# Patient Record
Sex: Female | Born: 1960 | Race: Black or African American | Hispanic: No | Marital: Married | Smoking: Never smoker
Health system: Southern US, Community
[De-identification: ages and names within clinical notes are randomized; demographics above are authoritative.]

## PROBLEM LIST (undated history)

## (undated) DIAGNOSIS — F329 Major depressive disorder, single episode, unspecified: Secondary | ICD-10-CM

## (undated) DIAGNOSIS — Z1371 Encounter for nonprocreative screening for genetic disease carrier status: Secondary | ICD-10-CM

## (undated) DIAGNOSIS — R32 Unspecified urinary incontinence: Secondary | ICD-10-CM

## (undated) DIAGNOSIS — N390 Urinary tract infection, site not specified: Secondary | ICD-10-CM

## (undated) DIAGNOSIS — N809 Endometriosis, unspecified: Secondary | ICD-10-CM

## (undated) DIAGNOSIS — G459 Transient cerebral ischemic attack, unspecified: Secondary | ICD-10-CM

## (undated) DIAGNOSIS — D219 Benign neoplasm of connective and other soft tissue, unspecified: Secondary | ICD-10-CM

## (undated) DIAGNOSIS — E119 Type 2 diabetes mellitus without complications: Secondary | ICD-10-CM

## (undated) DIAGNOSIS — G4730 Sleep apnea, unspecified: Secondary | ICD-10-CM

## (undated) DIAGNOSIS — F419 Anxiety disorder, unspecified: Secondary | ICD-10-CM

## (undated) DIAGNOSIS — F32A Depression, unspecified: Secondary | ICD-10-CM

## (undated) DIAGNOSIS — I10 Essential (primary) hypertension: Secondary | ICD-10-CM

## (undated) HISTORY — DX: Endometriosis, unspecified: N80.9

## (undated) HISTORY — DX: Major depressive disorder, single episode, unspecified: F32.9

## (undated) HISTORY — DX: Unspecified urinary incontinence: R32

## (undated) HISTORY — DX: Urinary tract infection, site not specified: N39.0

## (undated) HISTORY — DX: Depression, unspecified: F32.A

## (undated) HISTORY — DX: Type 2 diabetes mellitus without complications: E11.9

## (undated) HISTORY — DX: Essential (primary) hypertension: I10

## (undated) HISTORY — DX: Benign neoplasm of connective and other soft tissue, unspecified: D21.9

## (undated) HISTORY — DX: Sleep apnea, unspecified: G47.30

## (undated) HISTORY — DX: Encounter for nonprocreative screening for genetic disease carrier status: Z13.71

## (undated) HISTORY — DX: Transient cerebral ischemic attack, unspecified: G45.9

## (undated) HISTORY — DX: Anxiety disorder, unspecified: F41.9

---

## 1999-08-02 ENCOUNTER — Encounter: Payer: Self-pay | Admitting: Family Medicine

## 1999-08-02 ENCOUNTER — Ambulatory Visit (HOSPITAL_COMMUNITY): Admission: RE | Admit: 1999-08-02 | Discharge: 1999-08-02 | Payer: Self-pay | Admitting: Family Medicine

## 2000-01-24 ENCOUNTER — Encounter: Payer: Self-pay | Admitting: Family Medicine

## 2000-01-24 ENCOUNTER — Encounter: Admission: RE | Admit: 2000-01-24 | Discharge: 2000-01-24 | Payer: Self-pay | Admitting: Family Medicine

## 2000-03-06 ENCOUNTER — Emergency Department (HOSPITAL_COMMUNITY): Admission: EM | Admit: 2000-03-06 | Discharge: 2000-03-06 | Payer: Self-pay | Admitting: Emergency Medicine

## 2000-03-06 ENCOUNTER — Encounter: Payer: Self-pay | Admitting: Emergency Medicine

## 2000-04-16 ENCOUNTER — Encounter: Payer: Self-pay | Admitting: Obstetrics and Gynecology

## 2000-04-16 ENCOUNTER — Ambulatory Visit (HOSPITAL_COMMUNITY): Admission: RE | Admit: 2000-04-16 | Discharge: 2000-04-16 | Payer: Self-pay | Admitting: Obstetrics and Gynecology

## 2000-05-07 ENCOUNTER — Emergency Department (HOSPITAL_COMMUNITY): Admission: EM | Admit: 2000-05-07 | Discharge: 2000-05-07 | Payer: Self-pay | Admitting: Emergency Medicine

## 2001-03-12 ENCOUNTER — Ambulatory Visit (HOSPITAL_COMMUNITY): Admission: RE | Admit: 2001-03-12 | Discharge: 2001-03-12 | Payer: Self-pay | Admitting: Obstetrics and Gynecology

## 2001-12-24 ENCOUNTER — Encounter: Admission: RE | Admit: 2001-12-24 | Discharge: 2001-12-24 | Payer: Self-pay | Admitting: Urology

## 2001-12-24 ENCOUNTER — Encounter: Payer: Self-pay | Admitting: Urology

## 2002-05-26 ENCOUNTER — Other Ambulatory Visit: Admission: RE | Admit: 2002-05-26 | Discharge: 2002-05-26 | Payer: Self-pay | Admitting: Gynecology

## 2003-03-29 ENCOUNTER — Inpatient Hospital Stay (HOSPITAL_COMMUNITY): Admission: RE | Admit: 2003-03-29 | Discharge: 2003-04-01 | Payer: Self-pay | Admitting: Obstetrics and Gynecology

## 2003-03-29 ENCOUNTER — Encounter (INDEPENDENT_AMBULATORY_CARE_PROVIDER_SITE_OTHER): Payer: Self-pay | Admitting: *Deleted

## 2003-04-23 HISTORY — PX: ABDOMINAL HYSTERECTOMY: SHX81

## 2004-04-04 ENCOUNTER — Other Ambulatory Visit: Admission: RE | Admit: 2004-04-04 | Discharge: 2004-04-04 | Payer: Self-pay | Admitting: Obstetrics and Gynecology

## 2005-04-22 DIAGNOSIS — I10 Essential (primary) hypertension: Secondary | ICD-10-CM

## 2005-04-22 HISTORY — DX: Essential (primary) hypertension: I10

## 2005-04-22 HISTORY — PX: OOPHORECTOMY: SHX86

## 2007-01-07 ENCOUNTER — Encounter (HOSPITAL_BASED_OUTPATIENT_CLINIC_OR_DEPARTMENT_OTHER): Payer: Self-pay | Admitting: General Surgery

## 2007-01-07 ENCOUNTER — Ambulatory Visit (HOSPITAL_BASED_OUTPATIENT_CLINIC_OR_DEPARTMENT_OTHER): Admission: RE | Admit: 2007-01-07 | Discharge: 2007-01-07 | Payer: Self-pay | Admitting: General Surgery

## 2008-04-22 HISTORY — PX: OTHER SURGICAL HISTORY: SHX169

## 2008-11-16 ENCOUNTER — Encounter: Admission: RE | Admit: 2008-11-16 | Discharge: 2008-11-16 | Payer: Self-pay | Admitting: Obstetrics and Gynecology

## 2009-03-22 ENCOUNTER — Encounter (INDEPENDENT_AMBULATORY_CARE_PROVIDER_SITE_OTHER): Payer: Self-pay | Admitting: Obstetrics and Gynecology

## 2009-03-22 ENCOUNTER — Ambulatory Visit (HOSPITAL_COMMUNITY): Admission: RE | Admit: 2009-03-22 | Discharge: 2009-03-23 | Payer: Self-pay | Admitting: Obstetrics and Gynecology

## 2010-07-24 LAB — CBC
HCT: 37.4 % (ref 36.0–46.0)
MCHC: 32.1 g/dL (ref 30.0–36.0)
MCV: 88.5 fL (ref 78.0–100.0)
RBC: 4.23 MIL/uL (ref 3.87–5.11)
WBC: 12.2 10*3/uL — ABNORMAL HIGH (ref 4.0–10.5)

## 2010-07-25 LAB — URINALYSIS, ROUTINE W REFLEX MICROSCOPIC
Bilirubin Urine: NEGATIVE
Glucose, UA: NEGATIVE mg/dL
Hgb urine dipstick: NEGATIVE
Ketones, ur: NEGATIVE mg/dL
Protein, ur: NEGATIVE mg/dL
pH: 6.5 (ref 5.0–8.0)

## 2010-07-25 LAB — CBC
HCT: 42.9 % (ref 36.0–46.0)
MCHC: 32.1 g/dL (ref 30.0–36.0)
MCV: 88.4 fL (ref 78.0–100.0)
Platelets: 235 10*3/uL (ref 150–400)
RDW: 12.8 % (ref 11.5–15.5)
WBC: 5.1 10*3/uL (ref 4.0–10.5)

## 2010-07-25 LAB — BASIC METABOLIC PANEL
BUN: 9 mg/dL (ref 6–23)
CO2: 29 mEq/L (ref 19–32)
Chloride: 103 mEq/L (ref 96–112)
Glucose, Bld: 99 mg/dL (ref 70–99)
Potassium: 3.6 mEq/L (ref 3.5–5.1)

## 2010-08-26 ENCOUNTER — Emergency Department (HOSPITAL_COMMUNITY): Payer: BC Managed Care – PPO

## 2010-08-26 ENCOUNTER — Emergency Department (HOSPITAL_COMMUNITY)
Admission: EM | Admit: 2010-08-26 | Discharge: 2010-08-26 | Disposition: A | Discharge status: Home / Self Care | Payer: BC Managed Care – PPO | Attending: Emergency Medicine | Admitting: Emergency Medicine

## 2010-08-26 DIAGNOSIS — R42 Dizziness and giddiness: Secondary | ICD-10-CM | POA: Insufficient documentation

## 2010-08-26 DIAGNOSIS — R1110 Vomiting, unspecified: Secondary | ICD-10-CM | POA: Insufficient documentation

## 2010-08-26 DIAGNOSIS — I10 Essential (primary) hypertension: Secondary | ICD-10-CM | POA: Insufficient documentation

## 2010-08-26 LAB — COMPREHENSIVE METABOLIC PANEL
Albumin: 4 g/dL (ref 3.5–5.2)
BUN: 11 mg/dL (ref 6–23)
CO2: 30 mEq/L (ref 19–32)
Chloride: 98 mEq/L (ref 96–112)
Creatinine, Ser: 0.5 mg/dL (ref 0.4–1.2)
GFR calc non Af Amer: >60 mL/min (ref >60)
Total Bilirubin: 0.6 mg/dL (ref 0.3–1.2)

## 2010-08-26 LAB — CBC
MCH: 28.4 pg (ref 26.0–34.0)
MCV: 85.9 fL (ref 78.0–100.0)
Platelets: 219 10*3/uL (ref 150–400)
RBC: 5.18 MIL/uL — ABNORMAL HIGH (ref 3.87–5.11)

## 2010-08-26 LAB — DIFFERENTIAL
Eosinophils Absolute: 0 10*3/uL (ref 0.0–0.7)
Lymphs Abs: 1.9 10*3/uL (ref 0.7–4.0)
Monocytes Absolute: 0.7 10*3/uL (ref 0.1–1.0)
Monocytes Relative: 9 % (ref 3–12)
Neutrophils Relative %: 67 % (ref 43–77)

## 2010-08-27 ENCOUNTER — Other Ambulatory Visit (HOSPITAL_COMMUNITY): Payer: Self-pay | Admitting: Emergency Medicine

## 2010-08-27 DIAGNOSIS — R42 Dizziness and giddiness: Secondary | ICD-10-CM

## 2010-08-31 ENCOUNTER — Ambulatory Visit (HOSPITAL_COMMUNITY)
Admission: RE | Admit: 2010-08-31 | Discharge: 2010-08-31 | Disposition: A | Discharge status: Home / Self Care | Payer: BC Managed Care – PPO | Source: Ambulatory Visit | Attending: Emergency Medicine | Admitting: Emergency Medicine

## 2010-08-31 DIAGNOSIS — R42 Dizziness and giddiness: Secondary | ICD-10-CM

## 2010-08-31 DIAGNOSIS — D1800 Hemangioma unspecified site: Secondary | ICD-10-CM | POA: Insufficient documentation

## 2010-08-31 DIAGNOSIS — R269 Unspecified abnormalities of gait and mobility: Secondary | ICD-10-CM | POA: Insufficient documentation

## 2010-08-31 MED ORDER — GADOBENATE DIMEGLUMINE 529 MG/ML IV SOLN
20.0000 mL | Freq: Once | INTRAVENOUS | Status: AC
  Administered 2010-08-31: 20 mL via INTRAVENOUS

## 2010-09-04 NOTE — Op Note (Signed)
NAMERAYONA, SARDINHA               ACCOUNT NO.:  [id number]   MEDICAL RECORD NO.:  [id number]          PATIENT TYPE:  AMB   LOCATION:  DSC                          FACILITY:  [facility]   PHYSICIAN:  Leonie Man, M.D.   DATE OF BIRTH:  04/26/1960   DATE OF PROCEDURE:  01/07/2007  DATE OF DISCHARGE:                               OPERATIVE REPORT   PREOPERATIVE DIAGNOSIS:  Hemorrhoids.   POSTOPERATIVE DIAGNOSIS:  Hemorrhoids.   PROCEDURE:  Hemorrhoidectomy with removal of anal tags.   SURGEON:  Leonie Man, M.D.   ASSISTANT:  Operating room technician.   ANESTHESIA:  General.   Ms. Arciga is a 50 year old patient presenting with external hemorrhoids  with some internal prolapse associated with some bleeding.  She comes to  the operating room now for hemorrhoidectomy after the risk and potential  benefits of surgery had been fully discussed, all questions answered and  consent obtained.   PROCEDURE:  The patient positioned in the lithotomy position.  Following  the induction of satisfactory general anesthesia, the perianal tissues  are prepped with Betadine and draped to be included in the sterile  operative field.  A large hemorrhoidal complex is located at the  anterior midline and 8 o'clock axes.  These were both infiltrated with  0.5% Marcaine with epinephrine.  I used the electrocautery to dissect  across the mid cutaneous junction, dissecting the hemorrhoid free from  off of the underlying sphincter and removing it in its entirety.  The  second hemorrhoid was dealt with in the same fashion after injection  with 0.5% Marcaine.  Both of these hemorrhoidal incisions were closed  with a running suture of 2-0 chromic catgut.  All the areas of  dissection checked for hemostasis noted to be dry.  Sponge and  instrument counts were verified.  I used lidocaine soaked Gelfoam pads  to place over the hemorrhoidal incisions and a sterile dressing was  applied.  The anesthetic  reversed, patient removed from the operating  room to the recovery room in stable condition.  She tolerated the  procedure well.      Leonie Man, M.D.  Electronically Signed     PB/MEDQ  D:  01/07/2007  T:  01/07/2007  Job:  [id number]   cc:   Leonie Man, M.D.

## 2010-09-07 NOTE — Op Note (Signed)
[name]  Patient:    Deanna Caldwell, Deanna Caldwell Visit Number: [id number] MRN: [id number]          Service Type: DSU Location: [facility] Attending Physician:  Deanna Caldwell Dictated by:   Deanna Caldwell, M.D. Proc. Date: 03/12/01 Admit Date:  03/12/2001                             Operative Report  PREOPERATIVE DIAGNOSES:       Pelvic pain, increasing dysmenorrhea.  POSTOPERATIVE DIAGNOSES:      Pelvic adhesions, minimal pelvic endometriosis.  PROCEDURE:                    Diagnostic laparoscopy with lysis of adhesions and laser ablation of uterosacral ligaments.  SURGEON:                      Deanna Caldwell, M.D.  ANESTHESIA:                   General.  COMPLICATIONS:                None.  JUSTIFICATION:                The patient is a 50 year old black female status post prior tubal ligation with a history of persistent pelvic pain and increasing dysmenorrhea that did not respond to conservative treatment using nonsteroidal anti-inflammatory agents and antibiotics.  She presents now to undergo laparoscopy to see if an etiology for the pain can be established and corrected.  Risks and benefits of the procedure have been discussed with patient.  PROCEDURE:                    Patient was taken to the operating room, placed in the supine position.  General anesthesia was administered without difficulty.  She was then placed in the dorsal lithotomy position, prepped and draped in the usual sterile fashion.  Bladder was catheterized.  A Kahn cannula was then placed in the cervix and held.  Once this was done attention was directed to the abdomen where a small infraumbilical incision was made. The Veress needle was inserted and the abdomen was insufflated with 3 L of CO2.  Following this the trocar with the laparoscope was placed followed by the laparoscope itself.  To allow better visualization a 5 mm suprapubic port was established under direct visualization.   Systematic inspection showed the anterior bladder, peritoneum to be unremarkable.  The uterus was somewhat irregular in shape, globular, approximately 8 weeks in size.  Appeared to have small fibroids within the corpus of the uterus.  Patients tubes were then inspected.  Both proximal segments of the tubes were missing and only distal segments remained consistent with history of prior tubal sterilization.  There was very little, if any, proximal tubal segment left.  Distal tubal segments appeared to be normal; however, there were adhesions present between the distal segments and the lateral pelvic side wall and ovary.  In the cul-de-sac there were some filmy adhesions noticed and careful inspection of the peritoneum and cul-de-sac revealed very small pale white plaque like lesions which appeared to be consistent with nonpigmented endometriosis.  The intestinal surfaces appeared to be unremarkable.  The liver was inspected and was noted to be within normal limits.  The gallbladder appeared to be within normal limits.  No other abnormalities were noted.  By placing the  adhesions on stretch it was possible to use laparoscopic scissors to cut the adhesions down without difficulty and restore the anatomy to a normal configuration and to free the ovaries off the lateral pelvic side wall.  Once this was done it was elected to try to decrease the patients pain by using the YAG laser to laser ablate the uterosacral nerve.  The YAG laser was used with 15 watts of power with a GRP6 laser tip and the uterosacral ligaments were partially transected with care to avoid injury to any adjacent vascular structure or the ureter.  This was done without difficulty and with good hemostasis at the completion of the procedure.  At this point with no other abnormalities being noted it was elected to complete the procedure.  The CO2 was allowed to escape.  All instruments were removed.  The small incisions were  closed using subcuticular 4-0 Vicryl.  The port sites were injected with 0.25% Marcaine.  The patient tolerated the procedure well and was taken to the recovery room in satisfactory condition.  PLAN:                         The patient is to be seen back in the office in four weeks for follow-up examination.  She is to call for any problems such as fever, pain, or heavy bleeding.  DISCHARGE MEDICATIONS:        1. Tylox one q.3h. p.r.n. for pain.                               2. Doxycycline 100 mg b.i.d. x 3 days. Dictated by:   Deanna Caldwell, M.D. Attending Physician:  Deanna Caldwell DD:  03/12/01 TD:  03/12/01 Job: [id number]

## 2010-09-07 NOTE — Discharge Summary (Signed)
NAME:  Deanna Caldwell, Deanna Caldwell                         ACCOUNT NO.:  [id number]   MEDICAL RECORD NO.:  [id number]                   PATIENT TYPE:  INP   LOCATION:  9325                                 [facility]ACILITY:  [facility]   PHYSICIAN:  Miguel Aschoff, M.D.                    DATE OF BIRTH:  January 11, 1961   DATE OF ADMISSION:  03/29/2003  DATE OF DISCHARGE:  04/01/2003                                 DISCHARGE SUMMARY   ADMISSION DIAGNOSES:  1. Uterine fibroids.  2. Chronic pelvic pain.  3. Pelvic adhesions.  4. Menorrhagia.   BRIEF HISTORY:  The patient is a 50 year old black female with a long  history of heavy vaginal bleeding and pelvic pain.  The patient has been  treated in the past for pelvic pain with laparoscopy at which time she was  noted to have endometriosis.  In addition, she has used a variety of agents  to try to control the pelvic pain without success.  Because of her  symptomatology and the desire for definitive therapy, she presents now to  undergo a total abdominal hysterectomy.   HOSPITAL COURSE:  Preoperative studies were obtained. These revealed an  admission hemoglobin of 10.4, hematocrit 33.5, white count 7,700.  Chemistry  profile was within normal limits.  Urinalysis was negative.  On March 29, 2003 under general anesthesia a total abdominal hysterectomy was carried out  without difficulty.  This revealed the presence of a 188 gram uterus.  The  patient was noted to have benign leiomyoma, focal adenomyosis and multiple  serosal and fibrovascular adhesions.  The patient's hospital course was  essentially uncomplicated.  She tolerated increasing ambulation and diet  well.  Her hemoglobin did drop to 8.4 and remained stable.  By April 01, 2003 she was in satisfactory condition and able to be sent home.   DISCHARGE MEDICATIONS:  1. Chromagen iron therapy one daily for 30 days.  2. Tylox one every three hours as needed for pain.   SPECIAL INSTRUCTIONS:  She was  instructed in no heavy lifting, to place  nothing in the vagina or to call if there were any problems such as fever,  pain or heavy bleeding.   FOLLOW UP:  The patient is to be seen back in four weeks for a follow-up  examination.   DISPOSITION:  She was sent home in satisfactory condition.                                               Miguel Aschoff, M.D.    AR/MEDQ  D:  05/11/2003  T:  05/12/2003  Job:  [id number]

## 2010-09-07 NOTE — Op Note (Signed)
NAME:  Deanna Caldwell, Deanna Caldwell                         ACCOUNT NO.:  [id number]   MEDICAL RECORD NO.:  [id number]                   PATIENT TYPE:  INP   LOCATION:  NA                                   [facility]  [facility]   PHYSICIAN:  Miguel Aschoff, M.D.                    DATE OF BIRTH:  12-Oct-1960   DATE OF PROCEDURE:  03/29/2003  DATE OF DISCHARGE:                                 OPERATIVE REPORT   PREOPERATIVE DIAGNOSES:  1. Menorrhagia.  2. Chronic pelvic pain.  3. Uterine fibroids.   POSTOPERATIVE DIAGNOSES:  1. Menorrhagia.  2. Chronic pelvic pain.  3. Uterine fibroids.  4. Pelvic adhesions.   PROCEDURE:  Total abdominal hysterectomy.   SURGEON:  Miguel Aschoff, M.D.   ASSISTANT:  Carrington Clamp, M.D.   ANESTHESIA:  General.   COMPLICATIONS:  None.   JUSTIFICATION:  The patient is a 50 year old black female, gravida 5, para 3-  0-2-3, with a history of progressive increasing menorrhagia and pelvic pain.  Previous laparoscopy revealed the presence of uterine fibroids and  endometriosis.  Due to progression of her symptoms with failure to respond  to medical therapy, she presents now to undergo definitive therapy via a  total abdominal hysterectomy.  The risks and benefits of the procedure were  discussed with the patient.   DESCRIPTION OF PROCEDURE:  The patient was taken to the operating room and  placed in a supine position, and general anesthesia was administered without  difficulty.  She was then prepped and draped in the usual sterile fashion, a  Foley catheter was inserted.  A Pfannenstiel incision was made, extended  down through the subcutaneous tissue with bleeding points being clamped and  coagulated as they were encountered.  The fascia was then identified and  incised transversely and then separated from the underlying rectus muscles.  Rectus muscles were divided in the midline.  The peritoneum was then  identified and entered, carefully avoiding underlying structures.   At this  point the viscera were packed out of the pelvis, a self-retaining retractor  was placed through the wound.  Inspection revealed the uterus to be  globular, approximately eight to 10 weeks' size, with the presence of  uterine fibroids.  In addition, it was noted that the ovaries were adherent  to the posterior surface of the uterus by filmy adhesions.  The cul-de-sac  was unremarkable.  The intestinal surfaces appear to be unremarkable.  The  patient showed evidence of prior tubal sterilization.  At this point the  round ligaments were identified, clamped, cut, and suture ligated using  suture ligatures of 0 Vicryl.  Then a bladder flap was created anteriorly.  Preparations were then made below the utero-ovarian ligaments.  Heaney  clamps were then used to clamp the utero-ovarian ligament.  These were then  cut and suture ligated first with suture ligatures of 0 Vicryl and then free  ties of 0 Vicryl.  The broad ligament was then skeletonized, uterine vessels  identified, clamped with curved Heaney clamps.  These pedicles were cut and  suture ligated using suture ligatures of 0 Vicryl.  Then in serial fashion  on the right and left sides using straight Heaney clamps, progressive bites  were taken of the paracervical fascia.  All pedicles were cut and suture  ligated using suture ligatures of 0 Vicryl.  The uterosacral ligaments were  then found, clamped, cut, and suture ligated using suture ligatures of 0  Vicryl, and the cardinal ligaments were clamped, cut, and suture ligated in  a similar fashion.  At this point it was possible to clamp across the  reflection of the vagina onto the cervix.  Then the specimen was incised,  consisting of the uterus and the cervix.  The angles of the vaginal cuff  were then ligated using figure-of-eight sutures of 0 Vicryl.  The  uterosacral ligaments were incorporated for support.  The cuff was then  closed using running interlocking 0 Vicryl  suture.  The pelvic floor was  then reperitonealized using running continuous 2-0 Vicryl suture, pedicles  were placed in retroperitoneal positions, and there was excellent  hemostasis.  The pelvis then irrigated with warm saline.  Lap counts were  taken and found to be correct, and at this point the abdomen was closed.  The parietal peritoneum was closed using running continuous 0 Vicryl.  The  rectus muscles were reapproximated using running continuous 0 Vicryl suture.  The fascia was closed using two sutures of 0 Vicryl, each starting in the  lateral fascial angles and meeting in the midline.  The subcutaneous tissue  was closed using interrupted 0 Vicryl sutures and the skin incision was  closed using staples.  The estimated blood loss was approximately 100 mL.  The patient tolerated the procedure well and went to the recovery room in  satisfactory condition.                                               Miguel Aschoff, M.D.    AR/MEDQ  D:  03/29/2003  T:  03/29/2003  Job:  [id number]

## 2011-01-31 LAB — POCT HEMOGLOBIN-HEMACUE
Hemoglobin: 15.6 — ABNORMAL HIGH
Operator id: 208731

## 2011-06-25 DIAGNOSIS — H0589 Other disorders of orbit: Secondary | ICD-10-CM | POA: Insufficient documentation

## 2011-07-18 ENCOUNTER — Other Ambulatory Visit: Payer: Self-pay | Admitting: Obstetrics and Gynecology

## 2011-07-27 ENCOUNTER — Ambulatory Visit
Admission: RE | Admit: 2011-07-27 | Discharge: 2011-07-27 | Disposition: A | Discharge status: Home / Self Care | Payer: BC Managed Care – PPO | Source: Ambulatory Visit | Attending: Obstetrics and Gynecology | Admitting: Obstetrics and Gynecology

## 2011-07-27 MED ORDER — GADOBENATE DIMEGLUMINE 529 MG/ML IV SOLN
20.0000 mL | Freq: Once | INTRAVENOUS | Status: AC | PRN
  Administered 2011-07-27: 20 mL via INTRAVENOUS

## 2011-10-04 DIAGNOSIS — G459 Transient cerebral ischemic attack, unspecified: Secondary | ICD-10-CM

## 2011-10-04 HISTORY — DX: Transient cerebral ischemic attack, unspecified: G45.9

## 2011-10-16 ENCOUNTER — Encounter (HOSPITAL_COMMUNITY): Payer: Self-pay

## 2011-10-16 NOTE — Telephone Encounter (Signed)
This encounter was created in error - please disregard.

## 2011-11-08 ENCOUNTER — Encounter (HOSPITAL_COMMUNITY): Payer: Self-pay | Admitting: Psychiatry

## 2011-11-08 ENCOUNTER — Ambulatory Visit (INDEPENDENT_AMBULATORY_CARE_PROVIDER_SITE_OTHER): Payer: BC Managed Care – PPO | Admitting: Psychiatry

## 2011-11-08 VITALS — BP 126/75 | HR 69 | Ht 66.0 in | Wt 208.0 lb

## 2011-11-08 DIAGNOSIS — F411 Generalized anxiety disorder: Secondary | ICD-10-CM

## 2011-11-08 DIAGNOSIS — F329 Major depressive disorder, single episode, unspecified: Secondary | ICD-10-CM

## 2011-11-08 MED ORDER — VENLAFAXINE HCL ER 75 MG PO CP24
ORAL_CAPSULE | ORAL | Status: DC
Start: 2011-11-08 — End: 2011-12-18

## 2011-11-08 MED ORDER — VENLAFAXINE HCL ER 75 MG PO CP24: ORAL_CAPSULE | ORAL | Status: DC
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
## 2011-11-12 DIAGNOSIS — F329 Major depressive disorder, single episode, unspecified: Secondary | ICD-10-CM | POA: Insufficient documentation

## 2011-11-28 ENCOUNTER — Other Ambulatory Visit (HOSPITAL_COMMUNITY): Payer: Self-pay

## 2011-11-28 NOTE — Telephone Encounter (Signed)
Called patient. Left message that I would call tommorow.

## 2011-11-29 ENCOUNTER — Telehealth (HOSPITAL_COMMUNITY): Payer: Self-pay | Admitting: Psychiatry

## 2011-11-29 DIAGNOSIS — F329 Major depressive disorder, single episode, unspecified: Secondary | ICD-10-CM

## 2011-11-29 MED ORDER — DULOXETINE HCL 30 MG PO CPEP: ORAL_CAPSULE | ORAL | Status: DC

## 2011-11-29 NOTE — Telephone Encounter (Signed)
Patient reports that she prefers cymbalta 60 mg.   PLAN: 1. Will restart cymbalta 30 mg for 7 days, then increase to 60 mg daily.  Will taper effexor 75 mg daily for 7 days, then 75 mg every other day for 7 days then stop.

## 2011-12-05 ENCOUNTER — Ambulatory Visit (HOSPITAL_COMMUNITY): Payer: BC Managed Care – PPO | Admitting: Psychiatry

## 2011-12-18 ENCOUNTER — Encounter (HOSPITAL_COMMUNITY): Payer: Self-pay | Admitting: Psychiatry

## 2011-12-18 ENCOUNTER — Ambulatory Visit (INDEPENDENT_AMBULATORY_CARE_PROVIDER_SITE_OTHER): Payer: BC Managed Care – PPO | Admitting: Psychiatry

## 2011-12-18 VITALS — BP 137/83 | HR 76 | Ht 66.0 in | Wt 207.5 lb

## 2011-12-18 DIAGNOSIS — F329 Major depressive disorder, single episode, unspecified: Secondary | ICD-10-CM

## 2011-12-18 DIAGNOSIS — F411 Generalized anxiety disorder: Secondary | ICD-10-CM

## 2011-12-18 MED ORDER — VENLAFAXINE HCL ER 37.5 MG PO CP24: ORAL_CAPSULE | ORAL | Status: DC
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
## 2011-12-20 ENCOUNTER — Telehealth (HOSPITAL_COMMUNITY): Payer: Self-pay

## 2011-12-20 NOTE — Telephone Encounter (Signed)
PT WOULD LIKE YOU TO WRITE HER OUT OF WORK FOR 2 WKS.

## 2011-12-20 NOTE — Telephone Encounter (Signed)
Patient reports that she wants to take leave and has asked that this provider would write a note for leave. I have instructed to have the patient consider FMLA.

## 2011-12-23 ENCOUNTER — Encounter (HOSPITAL_COMMUNITY): Payer: Self-pay | Admitting: Psychiatry

## 2012-01-20 ENCOUNTER — Ambulatory Visit (HOSPITAL_COMMUNITY): Payer: Self-pay | Admitting: Psychiatry

## 2012-01-21 ENCOUNTER — Encounter (HOSPITAL_COMMUNITY): Payer: Self-pay | Admitting: Psychiatry

## 2012-01-31 ENCOUNTER — Telehealth (HOSPITAL_COMMUNITY): Payer: Self-pay

## 2012-01-31 DIAGNOSIS — F329 Major depressive disorder, single episode, unspecified: Secondary | ICD-10-CM

## 2012-01-31 MED ORDER — DULOXETINE HCL 60 MG PO CPEP: 60.0000 mg | ORAL_CAPSULE | Freq: Two times a day (BID) | ORAL | Status: DC

## 2012-01-31 NOTE — Telephone Encounter (Signed)
Will change dosage of cymbalta 60 mg bid.

## 2012-02-05 ENCOUNTER — Encounter (HOSPITAL_COMMUNITY): Payer: Self-pay | Admitting: Psychiatry

## 2012-02-05 ENCOUNTER — Ambulatory Visit (INDEPENDENT_AMBULATORY_CARE_PROVIDER_SITE_OTHER): Payer: BC Managed Care – PPO | Admitting: Psychiatry

## 2012-02-05 VITALS — BP 126/79 | HR 84 | Ht 66.0 in | Wt 206.0 lb

## 2012-02-05 DIAGNOSIS — F411 Generalized anxiety disorder: Secondary | ICD-10-CM

## 2012-02-05 DIAGNOSIS — F329 Major depressive disorder, single episode, unspecified: Secondary | ICD-10-CM
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
## 2012-03-02 ENCOUNTER — Encounter (HOSPITAL_COMMUNITY): Payer: Self-pay | Admitting: Psychiatry

## 2012-03-02 ENCOUNTER — Ambulatory Visit (INDEPENDENT_AMBULATORY_CARE_PROVIDER_SITE_OTHER): Payer: BC Managed Care – PPO | Admitting: Psychiatry

## 2012-03-02 VITALS — BP 126/87 | HR 95 | Ht 66.0 in | Wt 210.0 lb

## 2012-03-02 DIAGNOSIS — F419 Anxiety disorder, unspecified: Secondary | ICD-10-CM

## 2012-03-02 DIAGNOSIS — F411 Generalized anxiety disorder: Secondary | ICD-10-CM

## 2012-03-02 DIAGNOSIS — F329 Major depressive disorder, single episode, unspecified: Secondary | ICD-10-CM

## 2012-03-02 MED ORDER — DULOXETINE HCL 60 MG PO CPEP: 60.0000 mg | ORAL_CAPSULE | Freq: Every day | ORAL | Status: DC
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
## 2012-03-06 ENCOUNTER — Ambulatory Visit (HOSPITAL_COMMUNITY): Payer: Self-pay | Admitting: Psychiatry

## 2012-03-11 ENCOUNTER — Encounter (HOSPITAL_COMMUNITY): Payer: Self-pay | Admitting: Psychiatry

## 2012-03-11 ENCOUNTER — Ambulatory Visit (INDEPENDENT_AMBULATORY_CARE_PROVIDER_SITE_OTHER): Payer: BC Managed Care – PPO | Admitting: Psychiatry

## 2012-03-11 VITALS — BP 120/85 | HR 85 | Ht 66.0 in | Wt 206.0 lb

## 2012-03-11 DIAGNOSIS — F411 Generalized anxiety disorder: Secondary | ICD-10-CM

## 2012-03-11 DIAGNOSIS — F329 Major depressive disorder, single episode, unspecified: Secondary | ICD-10-CM

## 2012-03-11 MED ORDER — DULOXETINE HCL 30 MG PO CPEP: ORAL_CAPSULE | ORAL | Status: DC
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
## 2012-03-12 ENCOUNTER — Telehealth (HOSPITAL_COMMUNITY): Payer: Self-pay

## 2012-03-12 NOTE — Telephone Encounter (Signed)
Called patient. Will fill patient's form when patient's therapist gets clinic note.

## 2012-03-17 ENCOUNTER — Telehealth (HOSPITAL_COMMUNITY): Payer: Self-pay

## 2012-03-26 ENCOUNTER — Ambulatory Visit (INDEPENDENT_AMBULATORY_CARE_PROVIDER_SITE_OTHER): Payer: BC Managed Care – PPO | Admitting: Psychiatry

## 2012-03-26 ENCOUNTER — Encounter (HOSPITAL_COMMUNITY): Payer: Self-pay | Admitting: Psychiatry

## 2012-03-26 VITALS — BP 132/81 | HR 73 | Ht 66.0 in | Wt 202.0 lb

## 2012-03-26 DIAGNOSIS — F411 Generalized anxiety disorder: Secondary | ICD-10-CM

## 2012-03-26 DIAGNOSIS — F329 Major depressive disorder, single episode, unspecified: Secondary | ICD-10-CM

## 2012-03-26 MED ORDER — DULOXETINE HCL 30 MG PO CPEP: ORAL_CAPSULE | ORAL | Status: DC
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
## 2012-03-30 ENCOUNTER — Ambulatory Visit (HOSPITAL_COMMUNITY): Payer: Self-pay | Admitting: Psychiatry

## 2012-04-01 ENCOUNTER — Telehealth (HOSPITAL_COMMUNITY): Payer: Self-pay

## 2012-04-01 NOTE — Telephone Encounter (Signed)
Pt has returned to work but needs her paperwork updated to say working 1/2 days [phone]

## 2012-04-03 NOTE — Telephone Encounter (Signed)
Called patient back, left message stating I would call her back regarding her paperwork.

## 2012-04-10 ENCOUNTER — Telehealth (HOSPITAL_COMMUNITY): Payer: Self-pay

## 2012-04-10 NOTE — Telephone Encounter (Signed)
Called patient at home. She is doing.well. Taking care of herself.  She is taking her medications and denies any side effects.

## 2012-04-22 DIAGNOSIS — G4730 Sleep apnea, unspecified: Secondary | ICD-10-CM

## 2012-04-22 HISTORY — DX: Sleep apnea, unspecified: G47.30

## 2012-04-27 ENCOUNTER — Ambulatory Visit (HOSPITAL_COMMUNITY): Payer: Self-pay | Admitting: Psychiatry

## 2012-07-17 ENCOUNTER — Ambulatory Visit (INDEPENDENT_AMBULATORY_CARE_PROVIDER_SITE_OTHER): Payer: BC Managed Care – PPO | Admitting: Psychiatry

## 2012-07-17 ENCOUNTER — Encounter (HOSPITAL_COMMUNITY): Payer: Self-pay | Admitting: Psychiatry

## 2012-07-17 VITALS — BP 111/79 | HR 86 | Ht 66.0 in | Wt 211.0 lb

## 2012-07-17 DIAGNOSIS — F331 Major depressive disorder, recurrent, moderate: Secondary | ICD-10-CM

## 2012-07-17 DIAGNOSIS — F411 Generalized anxiety disorder: Secondary | ICD-10-CM

## 2012-07-17 DIAGNOSIS — F329 Major depressive disorder, single episode, unspecified: Secondary | ICD-10-CM

## 2012-07-17 MED ORDER — DULOXETINE HCL 60 MG PO CPEP: 60.0000 mg | ORAL_CAPSULE | Freq: Every day | ORAL | Status: DC
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
## 2012-08-05 ENCOUNTER — Ambulatory Visit (INDEPENDENT_AMBULATORY_CARE_PROVIDER_SITE_OTHER): Payer: BC Managed Care – PPO | Admitting: Psychiatry

## 2012-08-05 ENCOUNTER — Encounter (HOSPITAL_COMMUNITY): Payer: Self-pay | Admitting: Psychiatry

## 2012-08-05 VITALS — BP 129/86 | HR 82 | Ht 66.0 in | Wt 209.0 lb

## 2012-08-05 DIAGNOSIS — F329 Major depressive disorder, single episode, unspecified: Secondary | ICD-10-CM

## 2012-08-05 DIAGNOSIS — F411 Generalized anxiety disorder: Secondary | ICD-10-CM
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
## 2012-08-10 ENCOUNTER — Telehealth (HOSPITAL_COMMUNITY): Payer: Self-pay | Admitting: *Deleted

## 2012-08-10 NOTE — Telephone Encounter (Signed)
Pt would like you to call her regarding her return to work date being changed to 08/17/12 provider came in and call was transferred

## 2012-08-10 NOTE — Telephone Encounter (Signed)
Acknowleged. Called patient she will have company send in forms.

## 2012-08-12 ENCOUNTER — Telehealth (HOSPITAL_COMMUNITY): Payer: Self-pay

## 2012-08-14 NOTE — Telephone Encounter (Addendum)
Faxed form at 11:47 AM.

## 2012-08-20 ENCOUNTER — Ambulatory Visit (HOSPITAL_COMMUNITY): Payer: Self-pay | Admitting: Psychiatry

## 2012-08-21 ENCOUNTER — Encounter (HOSPITAL_COMMUNITY): Payer: Self-pay | Admitting: Psychiatry

## 2012-08-21 ENCOUNTER — Ambulatory Visit (INDEPENDENT_AMBULATORY_CARE_PROVIDER_SITE_OTHER): Payer: BC Managed Care – PPO | Admitting: Psychiatry

## 2012-08-21 VITALS — BP 124/65 | HR 96 | Ht 66.0 in | Wt 214.0 lb

## 2012-08-21 DIAGNOSIS — F411 Generalized anxiety disorder: Secondary | ICD-10-CM

## 2012-08-21 DIAGNOSIS — F331 Major depressive disorder, recurrent, moderate: Secondary | ICD-10-CM

## 2012-08-21 DIAGNOSIS — F329 Major depressive disorder, single episode, unspecified: Secondary | ICD-10-CM

## 2012-08-21 MED ORDER — DULOXETINE HCL 60 MG PO CPEP: 60.0000 mg | ORAL_CAPSULE | Freq: Every day | ORAL | Status: DC

## 2012-08-21 MED ORDER — DULOXETINE HCL 30 MG PO CPEP: 60.0000 mg | ORAL_CAPSULE | Freq: Every day | ORAL | Status: DC
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
## 2012-08-27 ENCOUNTER — Other Ambulatory Visit: Payer: Self-pay | Admitting: Obstetrics and Gynecology

## 2012-09-08 ENCOUNTER — Ambulatory Visit (HOSPITAL_COMMUNITY): Payer: Self-pay | Admitting: Licensed Clinical Social Worker

## 2012-09-11 ENCOUNTER — Ambulatory Visit (HOSPITAL_COMMUNITY): Payer: Self-pay | Admitting: Licensed Clinical Social Worker

## 2012-09-18 ENCOUNTER — Encounter (HOSPITAL_COMMUNITY): Payer: Self-pay | Admitting: Psychiatry

## 2012-09-18 ENCOUNTER — Ambulatory Visit (INDEPENDENT_AMBULATORY_CARE_PROVIDER_SITE_OTHER): Payer: BC Managed Care – PPO | Admitting: Psychiatry

## 2012-09-18 VITALS — BP 115/72 | HR 85 | Ht 66.0 in | Wt 218.5 lb

## 2012-09-18 DIAGNOSIS — F411 Generalized anxiety disorder: Secondary | ICD-10-CM

## 2012-09-18 DIAGNOSIS — F329 Major depressive disorder, single episode, unspecified: Secondary | ICD-10-CM
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
## 2012-09-22 ENCOUNTER — Ambulatory Visit (HOSPITAL_COMMUNITY): Payer: Self-pay | Admitting: Licensed Clinical Social Worker

## 2012-10-20 ENCOUNTER — Ambulatory Visit (INDEPENDENT_AMBULATORY_CARE_PROVIDER_SITE_OTHER): Payer: BC Managed Care – PPO | Admitting: Psychiatry

## 2012-10-20 ENCOUNTER — Encounter (HOSPITAL_COMMUNITY): Payer: Self-pay | Admitting: Psychiatry

## 2012-10-20 VITALS — BP 116/82 | HR 80 | Ht 66.0 in | Wt 214.5 lb

## 2012-10-20 DIAGNOSIS — F411 Generalized anxiety disorder: Secondary | ICD-10-CM

## 2012-10-20 DIAGNOSIS — F331 Major depressive disorder, recurrent, moderate: Secondary | ICD-10-CM

## 2012-10-20 DIAGNOSIS — F329 Major depressive disorder, single episode, unspecified: Secondary | ICD-10-CM

## 2012-10-20 MED ORDER — DULOXETINE HCL 60 MG PO CPEP: 60.0000 mg | ORAL_CAPSULE | Freq: Every day | ORAL | Status: DC

## 2012-10-20 MED ORDER — DULOXETINE HCL 30 MG PO CPEP: 30.0000 mg | ORAL_CAPSULE | Freq: Every day | ORAL | Status: DC
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
## 2012-12-01 ENCOUNTER — Ambulatory Visit (INDEPENDENT_AMBULATORY_CARE_PROVIDER_SITE_OTHER): Payer: BC Managed Care – PPO | Admitting: Psychiatry

## 2012-12-01 ENCOUNTER — Encounter (HOSPITAL_COMMUNITY): Payer: Self-pay | Admitting: Psychiatry

## 2012-12-01 VITALS — BP 110/72 | HR 88 | Ht 66.0 in | Wt 218.5 lb

## 2012-12-01 DIAGNOSIS — F329 Major depressive disorder, single episode, unspecified: Secondary | ICD-10-CM

## 2012-12-01 DIAGNOSIS — F411 Generalized anxiety disorder: Secondary | ICD-10-CM
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
## 2012-12-04 ENCOUNTER — Telehealth (HOSPITAL_COMMUNITY): Payer: Self-pay

## 2012-12-04 DIAGNOSIS — F331 Major depressive disorder, recurrent, moderate: Secondary | ICD-10-CM

## 2012-12-04 MED ORDER — DULOXETINE HCL 60 MG PO CPEP: 60.0000 mg | ORAL_CAPSULE | Freq: Every day | ORAL | Status: DC

## 2012-12-04 NOTE — Telephone Encounter (Signed)
She reports that she called her husband who had told her he is not ready to see her back.  The patient reports that she feels as if she is not going to be able to function at work.  She denies any suicidal ideation, intent, or plans.  She requires a refill of her medications.  PLAN: Will increase duloxetine to 120 mg daily and allow patient to apply for FMLA.

## 2012-12-10 ENCOUNTER — Encounter (HOSPITAL_COMMUNITY): Payer: Self-pay | Admitting: Psychiatry

## 2012-12-10 ENCOUNTER — Ambulatory Visit (INDEPENDENT_AMBULATORY_CARE_PROVIDER_SITE_OTHER): Payer: BC Managed Care – PPO | Admitting: Psychiatry

## 2012-12-10 VITALS — BP 115/78 | HR 92 | Ht 66.0 in | Wt 220.0 lb

## 2012-12-10 DIAGNOSIS — F332 Major depressive disorder, recurrent severe without psychotic features: Secondary | ICD-10-CM

## 2012-12-10 DIAGNOSIS — F419 Anxiety disorder, unspecified: Secondary | ICD-10-CM

## 2012-12-10 DIAGNOSIS — F411 Generalized anxiety disorder: Secondary | ICD-10-CM
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
## 2012-12-14 DIAGNOSIS — F419 Anxiety disorder, unspecified: Secondary | ICD-10-CM | POA: Insufficient documentation

## 2012-12-14 DIAGNOSIS — F332 Major depressive disorder, recurrent severe without psychotic features: Secondary | ICD-10-CM | POA: Insufficient documentation

## 2012-12-15 ENCOUNTER — Ambulatory Visit (INDEPENDENT_AMBULATORY_CARE_PROVIDER_SITE_OTHER): Payer: BC Managed Care – PPO | Admitting: Psychiatry

## 2012-12-15 ENCOUNTER — Encounter (HOSPITAL_COMMUNITY): Payer: Self-pay | Admitting: Psychiatry

## 2012-12-15 VITALS — BP 126/71 | HR 89 | Ht 66.0 in | Wt 226.0 lb

## 2012-12-15 DIAGNOSIS — F411 Generalized anxiety disorder: Secondary | ICD-10-CM

## 2012-12-15 DIAGNOSIS — F332 Major depressive disorder, recurrent severe without psychotic features: Secondary | ICD-10-CM

## 2012-12-15 MED ORDER — LITHIUM CARBONATE 150 MG PO CAPS: ORAL_CAPSULE | ORAL | Status: DC
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
## 2012-12-23 ENCOUNTER — Telehealth (HOSPITAL_COMMUNITY): Payer: Self-pay

## 2012-12-23 NOTE — Telephone Encounter (Addendum)
Patient reports she saw her husband with another woman.  She reports she is having difficulty at work. She is planning to go to the her son's home in [place]. The patient denies any suicidal thoughts. She request's leave of absence. Will continue lithium and consider increase as needed. She will call to reschedule her appointment.

## 2013-01-01 ENCOUNTER — Ambulatory Visit (HOSPITAL_COMMUNITY): Payer: Self-pay | Admitting: Psychiatry

## 2013-01-07 ENCOUNTER — Telehealth (HOSPITAL_COMMUNITY): Payer: Self-pay | Admitting: Psychiatry

## 2013-01-08 NOTE — Telephone Encounter (Signed)
Erroneous encounter

## 2013-01-21 ENCOUNTER — Ambulatory Visit (HOSPITAL_COMMUNITY): Payer: Self-pay | Admitting: Psychiatry

## 2013-01-21 ENCOUNTER — Ambulatory Visit (INDEPENDENT_AMBULATORY_CARE_PROVIDER_SITE_OTHER): Payer: BC Managed Care – PPO | Admitting: Podiatry

## 2013-01-21 ENCOUNTER — Encounter: Payer: Self-pay | Admitting: Podiatry

## 2013-01-21 VITALS — BP 111/60 | HR 91 | Temp 98.0°F | Resp 16 | Ht 66.0 in

## 2013-01-21 DIAGNOSIS — M2020 Hallux rigidus, unspecified foot: Secondary | ICD-10-CM

## 2013-01-21 DIAGNOSIS — M2022 Hallux rigidus, left foot: Secondary | ICD-10-CM

## 2013-01-21 NOTE — Progress Notes (Signed)
This patient presents today status post Keller arthroplasty with a single silicone implant x2 weeks left. She denies fever chills nausea vomiting muscle aches or pains. States it seems to be doing good.  Objective: Vital signs are stable she is alert and oriented x3. Pulses to the left foot are intact and there is no calf pain. Dry sterile dressing was removed. Mild edema, no erythema cellulitis drainage or odor. Sutures are intact and margins are well coapted. She has a good range of motion of the first metatarsophalangeal joint of the left foot. She seems to be quite excited about the range of motion.  Assessment: Well-healing surgical foot left status post 2 weeks Keller arthroplasty single silicone implant.  Plan: We removed the ends of the sutures today. Placed her in a compression anklet. And put her in a Darco shoe. I will allow her to start getting this wet, encouraged range of motion exercises and massage therapy. We will followup with her in 2 weeks for another set of x-rays.

## 2013-01-21 NOTE — Patient Instructions (Signed)
Discontinue boot start wearing darco shoe and anklet.  Wash foot as usual and soak in epsom salt water afterward.  Continue range of motion exercises and follow up with me in two weeks.

## 2013-02-04 ENCOUNTER — Encounter: Payer: Self-pay | Admitting: Podiatry

## 2013-02-04 ENCOUNTER — Ambulatory Visit (INDEPENDENT_AMBULATORY_CARE_PROVIDER_SITE_OTHER): Payer: BC Managed Care – PPO | Admitting: Podiatry

## 2013-02-04 ENCOUNTER — Ambulatory Visit (INDEPENDENT_AMBULATORY_CARE_PROVIDER_SITE_OTHER): Payer: BC Managed Care – PPO

## 2013-02-04 VITALS — BP 115/58 | HR 80 | Resp 16 | Ht 66.0 in | Wt 215.0 lb

## 2013-02-04 DIAGNOSIS — Z9889 Other specified postprocedural states: Secondary | ICD-10-CM

## 2013-02-04 DIAGNOSIS — M2022 Hallux rigidus, left foot: Secondary | ICD-10-CM

## 2013-02-04 DIAGNOSIS — M2020 Hallux rigidus, unspecified foot: Secondary | ICD-10-CM

## 2013-02-04 NOTE — Progress Notes (Signed)
Deanna Caldwell presents today one month status post Keller arthroplasty with a single silicone implant with grommets. She states it is getting better everyday but I still has some numbness.  Objective: Vital signs are stable she is alert and oriented x3. Pulses remain palpable left foot. She is a fantastic range of motion of the first metatarsophalangeal joint left. Radiographs today demonstrate a well healing Keller arthroplasty with a single silicone implant.  Assessment: Well-healing Keller arthroplasty single silicone implant left.  Plan: We discussed etiology pathology conservative versus surgical therapies. At this point she is to get back into her tennis shoes. Continue range continue range of motion exercises. Followup with me in one month for another set of x-rays.

## 2013-02-09 ENCOUNTER — Ambulatory Visit: Payer: BC Managed Care – PPO | Admitting: Podiatry

## 2013-03-02 ENCOUNTER — Ambulatory Visit (INDEPENDENT_AMBULATORY_CARE_PROVIDER_SITE_OTHER): Payer: BC Managed Care – PPO | Admitting: Podiatry

## 2013-03-02 ENCOUNTER — Encounter: Payer: Self-pay | Admitting: Podiatry

## 2013-03-02 ENCOUNTER — Ambulatory Visit (INDEPENDENT_AMBULATORY_CARE_PROVIDER_SITE_OTHER): Payer: BC Managed Care – PPO

## 2013-03-02 VITALS — BP 104/62 | HR 76 | Resp 16

## 2013-03-02 DIAGNOSIS — Z9889 Other specified postprocedural states: Secondary | ICD-10-CM

## 2013-03-02 NOTE — Progress Notes (Signed)
Deanna Caldwell presents today as a 52 year old black female followup of a Keller arthroplasty with single silicone implant. She states it is still some tender but is getting better. I have reviewed her past history medications and allergies.  Objective: Vital signs are stable she is alert and oriented x3 pulses remain palpable left she's got good range of motion first metatarsophalangeal joint left foot mild edema.  Evaluation demonstrates Keller arthroplasty single silicone implant intact.  Assessment well-healing surgical foot left.  Plan: Try to get back to regular routine.

## 2013-03-09 ENCOUNTER — Encounter: Payer: BC Managed Care – PPO | Admitting: Podiatry

## 2013-04-08 ENCOUNTER — Ambulatory Visit (INDEPENDENT_AMBULATORY_CARE_PROVIDER_SITE_OTHER): Payer: BC Managed Care – PPO

## 2013-04-08 ENCOUNTER — Encounter: Payer: Self-pay | Admitting: Podiatry

## 2013-04-08 ENCOUNTER — Ambulatory Visit (INDEPENDENT_AMBULATORY_CARE_PROVIDER_SITE_OTHER): Payer: BC Managed Care – PPO | Admitting: Podiatry

## 2013-04-08 VITALS — BP 97/55 | HR 79 | Resp 16 | Ht 66.0 in | Wt 200.0 lb

## 2013-04-08 DIAGNOSIS — Z9889 Other specified postprocedural states: Secondary | ICD-10-CM

## 2013-04-08 NOTE — Progress Notes (Signed)
Panel presents today for her final postop visit regarding her first metatarsophalangeal joint arthroplasty left foot. She denies fever chills nausea vomiting muscle aches or pains. States it is still has a little numbness on occasion.  Objective: Vital signs are stable she is alert and oriented x3. Minimal edema about the left foot good range of motion of the first metatarsophalangeal joint. Radiographic evaluation demonstrates very nice Keller arthroplasty with single silicone implant it is intact.  Assessment: Well-healing surgical foot left.  Plan: Followup with me on an as-needed basis.

## 2013-04-13 ENCOUNTER — Encounter: Payer: BC Managed Care – PPO | Admitting: Podiatry

## 2013-04-20 ENCOUNTER — Telehealth: Payer: Self-pay | Admitting: *Deleted

## 2013-04-20 NOTE — Telephone Encounter (Addendum)
Pt states last office visit Dr Al Corpus released her to begin all activities and also she had a crack bone on top of her foot.  Pt states she now has pain in the top of her foot due to the increased activity, and would like Oxycodone.  I will inform Dr. Al Corpus.  Dr Al Corpus ordered Percocet 10-325 #30 one tablet every 8 hours prn pain.  I informed pt of the rx and she would need to pick up the rx at the [facility] office.

## 2013-04-21 MED ORDER — OXYCODONE-ACETAMINOPHEN 10-325 MG PO TABS: 1.0000 | ORAL_TABLET | Freq: Three times a day (TID) | ORAL | Status: DC | PRN

## 2013-04-21 NOTE — Telephone Encounter (Signed)
Val you can give her percocet 10-325 #30

## 2013-09-14 ENCOUNTER — Encounter: Payer: Self-pay | Admitting: Podiatry

## 2013-09-14 ENCOUNTER — Ambulatory Visit (INDEPENDENT_AMBULATORY_CARE_PROVIDER_SITE_OTHER): Payer: BC Managed Care – PPO | Admitting: Podiatry

## 2013-09-14 ENCOUNTER — Ambulatory Visit (INDEPENDENT_AMBULATORY_CARE_PROVIDER_SITE_OTHER): Payer: BC Managed Care – PPO

## 2013-09-14 VITALS — BP 108/67 | HR 82 | Resp 16

## 2013-09-14 DIAGNOSIS — M779 Enthesopathy, unspecified: Secondary | ICD-10-CM

## 2013-09-14 DIAGNOSIS — M7750 Other enthesopathy of unspecified foot: Secondary | ICD-10-CM

## 2013-09-14 DIAGNOSIS — M2010 Hallux valgus (acquired), unspecified foot: Secondary | ICD-10-CM

## 2013-09-14 DIAGNOSIS — M778 Other enthesopathies, not elsewhere classified: Secondary | ICD-10-CM

## 2013-09-14 DIAGNOSIS — Z9889 Other specified postprocedural states: Secondary | ICD-10-CM

## 2013-09-14 MED ORDER — DICLOFENAC SODIUM 75 MG PO TBEC: 75.0000 mg | DELAYED_RELEASE_TABLET | Freq: Two times a day (BID) | ORAL | Status: DC

## 2013-09-14 NOTE — Progress Notes (Signed)
She presents today with a chief complaint of a painful first metatarsophalangeal joint particularly when she is wearing high heels. A year or more ago we performed a second metatarsal osteotomy and a Keller arthroplasty with single silicone implant. At that time her sesamoids were painful. This is where she's complaining that the majority of her pain is today.  Objective: I have reviewed her past mental history medications allergies surgeries and social history. Pulses are palpable bilateral. Neurologic sensorium is intact. 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 The joint itself appears to be normal and open without spurring. There is some soft tissue swelling overlying the first metatarsophalangeal joint.  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  Plan: We sent her to physical therapy today for evaluation and treatment inability of the sesamoidal apparatus. She will continue to take anti-inflammatories on a regular basis and I will followup with her as needed.

## 2013-10-13 ENCOUNTER — Telehealth: Payer: Self-pay | Admitting: *Deleted

## 2013-10-13 NOTE — Telephone Encounter (Signed)
Yes that will be fine. 

## 2013-10-13 NOTE — Telephone Encounter (Signed)
Calling to see if Dr. Milinda Pointer can extend my handicap sticker for another 6 months?  Next appointment is in a couple of weeks.  The one that I have will have expired by then.  Give me a call whether I can or cannot.

## 2013-10-13 NOTE — Telephone Encounter (Signed)
I left her a message that she can come by the office to pick up her handicap requisition form.  Okay for another 6 months per Dr. Milinda Pointer.

## 2013-11-02 ENCOUNTER — Ambulatory Visit (INDEPENDENT_AMBULATORY_CARE_PROVIDER_SITE_OTHER): Payer: BC Managed Care – PPO | Admitting: Podiatry

## 2013-11-02 ENCOUNTER — Encounter: Payer: Self-pay | Admitting: Podiatry

## 2013-11-02 VITALS — BP 115/69 | HR 68 | Resp 12

## 2013-11-02 DIAGNOSIS — M7750 Other enthesopathy of unspecified foot: Secondary | ICD-10-CM

## 2013-11-02 DIAGNOSIS — M778 Other enthesopathies, not elsewhere classified: Secondary | ICD-10-CM

## 2013-11-02 DIAGNOSIS — M779 Enthesopathy, unspecified: Principal | ICD-10-CM

## 2013-11-02 DIAGNOSIS — M948X9 Other specified disorders of cartilage, unspecified sites: Secondary | ICD-10-CM

## 2013-11-02 DIAGNOSIS — M2580 Other specified joint disorders, unspecified joint: Secondary | ICD-10-CM

## 2013-11-03 NOTE — Progress Notes (Signed)
Deanna Caldwell presents today for followup of her capsulitis and sesamoiditis of the first metatarsophalangeal joint left. She states that she was unable to do physical therapy because of the distance from her work site.  Objective: Vital signs are stable she is alert and oriented x3. She has pain on palpation to the tibial sesamoid apparatus of the left foot. More than likely associated with osteoarthritis.  Assessment: Pain in limb secondary to sesamoiditis and osteoarthritis.  Plan: She will continue her nonsteroidals and she will continue these on regular basis. I suggested that she get a name of the physical therapy group near her and we will be more than happy to refer her today group. I will followup with her in 4-6 weeks.

## 2013-11-10 ENCOUNTER — Telehealth: Payer: Self-pay | Admitting: *Deleted

## 2013-11-10 NOTE — Telephone Encounter (Signed)
Had an appointment on the 14th.  Dr. Milinda Pointer wants to refer me to have physical therapy.  Please call when I am ready to set up an appointment.  Said to reach out to [facility] to have the referral sent to the location I chose.  Physical therapy will be in [place] where I live.  I returned her call.  I asked her if she had a place in mind.  She said it's called [facility], the phone number is [phone] and the fax number is [phone].  I told her I would send the referral over.

## 2013-11-10 NOTE — Telephone Encounter (Signed)
I faxed over a referral for Physical Therapy.  Diagnosis: Sesamoiditis and Capsulitis Left foot.  Evaluate and treat 3 times a week for 4 weeks.   [facility] [address] [place], [place] [address] Ph: [phone]

## 2014-03-04 ENCOUNTER — Telehealth: Payer: Self-pay | Admitting: *Deleted

## 2014-03-04 NOTE — Telephone Encounter (Signed)
"  I'm calling because I want to get me a pair of those Orthropedic clogs to wear to work.  My insurance flex spending said they would reimburse me if I show something that I have had foot surgeries and the type of shoe is approved by my doctor.  Please give me a call back.  I would appreciate it.  Thank you.

## 2014-03-07 NOTE — Telephone Encounter (Signed)
That will be fine if you want to write her a note stating that she is a surgical patient and that it would be in her best interest to were that shoe type.

## 2014-03-14 ENCOUNTER — Encounter: Payer: Self-pay | Admitting: *Deleted

## 2014-05-17 ENCOUNTER — Encounter: Payer: Self-pay | Admitting: Podiatry

## 2014-08-04 ENCOUNTER — Encounter: Payer: Self-pay | Admitting: Sports Medicine

## 2014-08-19 ENCOUNTER — Ambulatory Visit (INDEPENDENT_AMBULATORY_CARE_PROVIDER_SITE_OTHER): Payer: BLUE CROSS/BLUE SHIELD

## 2014-08-19 ENCOUNTER — Ambulatory Visit (INDEPENDENT_AMBULATORY_CARE_PROVIDER_SITE_OTHER): Payer: BLUE CROSS/BLUE SHIELD | Admitting: Sports Medicine

## 2014-08-19 ENCOUNTER — Encounter: Payer: Self-pay | Admitting: Sports Medicine

## 2014-08-19 VITALS — BP 118/82 | HR 82 | Ht 67.0 in | Wt 218.0 lb

## 2014-08-19 DIAGNOSIS — M170 Bilateral primary osteoarthritis of knee: Secondary | ICD-10-CM | POA: Insufficient documentation

## 2014-08-19 MED ORDER — CELECOXIB 200 MG PO CAPS: ORAL_CAPSULE | ORAL | Status: DC

## 2014-08-19 NOTE — Assessment & Plan Note (Signed)
Unfortunately has failed oral medications including NSAIDs and Tylenol. Bilateral injection. X-rays, formal physical therapy, Celebrex as needed. Return in one month.

## 2014-08-19 NOTE — Progress Notes (Signed)
   Subjective:    I'm seeing this patient as a consultation for:  Dr. Enrique Sack  CC: Bilateral knee pain  HPI: Deanna Caldwell is a pleasant 54 year old female who comes in with a several months history of pain that she localizes on both knees, right worse than left localized at the medial joint line without radiation, also under the patella, worse going up and down stairs with stiffness in the morning, no trauma, minimal swelling. She desires to exercise more but is limited by her knee pain. She's tried multiple oral anti-inflammatories and analgesics without any improvement.  Past medical history, Surgical history, Family history not pertinant except as noted below, Social history, Allergies, and medications have been entered into the medical record, reviewed, and no changes needed.   Review of Systems: No headache, visual changes, nausea, vomiting, diarrhea, constipation, dizziness, abdominal pain, skin rash, fevers, chills, night sweats, weight loss, swollen lymph nodes, body aches, joint swelling, muscle aches, chest pain, shortness of breath, mood changes, visual or auditory hallucinations.   Objective:   General: Well Developed, well nourished, and in no acute distress.  Neuro/Psych: Alert and oriented x3, extra-ocular muscles intact, able to move all 4 extremities, sensation grossly intact. Skin: Warm and dry, no rashes noted.  Respiratory: Not using accessory muscles, speaking in full sentences, trachea midline.  Cardiovascular: Pulses palpable, no extremity edema. Abdomen: Does not appear distended. Bilateral knees: Minimal visible swelling with only minimal fluid wave and effusion, tender to palpation at the medial joint line bilaterally. ROM normal in flexion and extension and lower leg rotation. Ligaments with solid consistent endpoints including ACL, PCL, LCL, MCL. Negative Mcmurray's and provocative meniscal tests. Non painful patellar compression. Patellar and quadriceps  tendons unremarkable. Hamstring and quadriceps strength is normal.  Procedure: Real-time Ultrasound Guided Injection of right knee Device: GE Logiq E  Verbal informed consent obtained.  Time-out conducted.  Noted no overlying erythema, induration, or other signs of local infection.  Skin prepped in a sterile fashion.  Local anesthesia: Topical Ethyl chloride.  With sterile technique and under real time ultrasound guidance:  25-gauge needle advanced into the suprapatellar recess, 1 mL kenalog 40, 4 mL lidocaine injected easily. Completed without difficulty  Pain immediately resolved suggesting accurate placement of the medication.  Advised to call if fevers/chills, erythema, induration, drainage, or persistent bleeding.  Images permanently stored and available for review in the ultrasound unit.  Impression: Technically successful ultrasound guided injection.  Procedure: Real-time Ultrasound Guided Injection of left knee Device: GE Logiq E  Verbal informed consent obtained.  Time-out conducted.  Noted no overlying erythema, induration, or other signs of local infection.  Skin prepped in a sterile fashion.  Local anesthesia: Topical Ethyl chloride.  With sterile technique and under real time ultrasound guidance:  25-gauge needle advanced into the suprapatellar recess, 1 mL kenalog 40, 4 mL lidocaine injected easily. Completed without difficulty  Pain immediately resolved suggesting accurate placement of the medication.  Advised to call if fevers/chills, erythema, induration, drainage, or persistent bleeding.  Images permanently stored and available for review in the ultrasound unit.  Impression: Technically successful ultrasound guided injection.  Impression and Recommendations:   This case required medical decision making of moderate complexity.

## 2014-08-22 ENCOUNTER — Telehealth: Payer: Self-pay | Admitting: Internal Medicine

## 2014-08-22 NOTE — Telephone Encounter (Signed)
Received fax from [facility] and Celecoxib is approved from 07/23/2014 - 08/22/2015. Case # [id number] - CF

## 2014-08-22 NOTE — Telephone Encounter (Signed)
Received fax on Celecoxib sent through cover my meds and received approval. - CF

## 2014-09-16 ENCOUNTER — Ambulatory Visit: Payer: Self-pay | Admitting: Sports Medicine

## 2014-09-29 ENCOUNTER — Ambulatory Visit (INDEPENDENT_AMBULATORY_CARE_PROVIDER_SITE_OTHER): Payer: BLUE CROSS/BLUE SHIELD | Admitting: Rehabilitative and Restorative Service Providers"

## 2014-09-29 DIAGNOSIS — M25561 Pain in right knee: Secondary | ICD-10-CM | POA: Diagnosis not present

## 2014-09-29 DIAGNOSIS — M25662 Stiffness of left knee, not elsewhere classified: Secondary | ICD-10-CM | POA: Diagnosis not present

## 2014-09-29 DIAGNOSIS — M25661 Stiffness of right knee, not elsewhere classified: Secondary | ICD-10-CM

## 2014-09-29 DIAGNOSIS — M25562 Pain in left knee: Secondary | ICD-10-CM | POA: Diagnosis not present

## 2014-09-29 NOTE — Patient Instructions (Signed)
DO NOT STAND WITH KNEES LOCKED/TIGHTENED/HYPEREXTENDED   Hip Flexor, Quadricep Stretch: Belly Down (Strap)  Can lie flat - youdon't have to be propped up Hold top of foot with hand or strap. Hold for _20-30 sec  Repeat _3___ times each leg.    HIP: Hamstrings - Supine   Place strap around foot. Raise leg up, keep knee straight. Bring leg across body. Hold 30 seconds. _3 reps per set, _2-3 per day    Piriformis Stretch   Lying on back, pull right knee toward opposite shoulder. Hold __30__ seconds. Repeat _3___ times. Do __2-3__ sessions per day.    HIP: Hamstrings - Supine   Place strap around foot. Raise leg up, keep knee straight. BEND opposite knee. Hold _30__ seconds. __3_ reps per set, _2-3__ sets per day

## 2014-09-29 NOTE — Therapy (Addendum)
[facility] [place], [place], [address] Phone: [phone]   Fax:  [phone]  Physical Therapy Evaluation  Patient Details  Name: Deanna Caldwell MRN: [id number] Date of Birth: 06/23/1960 Referring Provider:  Silverio Decamp,*  Encounter Date: 09/29/2014      PT End of Session - 09/29/14 1634    Visit Number 1   Number of Visits 16   Date for PT Re-Evaluation 11/24/14   PT Start Time 3818   PT Stop Time 1737   PT Time Calculation (min) 58 min   Activity Tolerance Patient tolerated treatment well;No increased pain   Behavior During Therapy [facility] for tasks assessed/performed      Past Medical History  Diagnosis Date  . TIA (transient ischemic attack) 10/04/2011  . Hypertension 2007  . Diabetes mellitus, type II     12/31/2011  . Sleep apnea 2014  . UTI (urinary tract infection)     Past Surgical History  Procedure Laterality Date  . Foot surgery  2010    Removed cartilage from both feet  . Abdominal hysterectomy  2005    There were no vitals filed for this visit.  Visit Diagnosis:  Knee pain, acute, right - Plan: PT plan of care cert/re-cert  Knee pain, acute, left - Plan: PT plan of care cert/re-cert  Stiffness of knee joint, right - Plan: PT plan of care cert/re-cert  Stiffness of knee joint, left - Plan: PT plan of care cert/re-cert      Subjective Assessment - 09/29/14 1644    Subjective Patient presents with bilat knee pain Rt > LT. Has had pain for the past 4 months with no known cause of symptoms. She was exercising more - eliptical or bike    Pertinent History Surgery on bilat feet for cartilage problems - 2008 - 2014 two on each foot. Hysterectomy 2005   How long can you sit comfortably? no limit   How long can you stand comfortably? 1-2 hours   How long can you walk comfortably? 1 -2 hours   Diagnostic tests xrays - arthritic changes    Patient Stated Goals Wants to be  able to take less medicatioin and continue to be active and exercise   Currently in Pain? No/denies            [facility] PT Assessment - 09/29/14 0001    Assessment   Medical Diagnosis bilat knee pain   Onset Date/Surgical Date --  02/16   Hand Dominance Left   Next MD Visit 10/13/14   Prior Therapy none   Balance Screen   Has the patient fallen in the past 6 months No   [place] residence   Type of [place] Access Level entry   Home Layout Two level   Alternate Level Stairs-Number of Steps 14 to upstairs   Alternate Level Stairs-Rails Left  going up   Prior Function   Level of [place] Independent   Vocation Full time employment   Vocation Requirements sitting at desk/computer    Leisure riding bike/walking/shopping/household chores   Observation/Other Assessments   Focus on Therapeutic Outcomes (FOTO)  22 % limitation   Sensation   Additional Comments denies numbness or tingling B LE's   ROM / Strength   AROM / PROM / Strength --  AROM/strength bilat LE's WFL's except hip abd 4+/5 Rt   Flexibility   Soft Tissue Assessment /Muscle Length yes  Quadriceps tightness noted at ~120 degrees knee flexion in prone - Rt > Lt   ITB bilat tightness -  with hip adductionin sidelyning Rt > Lt   Piriformis bilat tightness - Rt > Lt   Palpation   Patella mobility crepitus with flexion and extension Rt > Lt          [facility] Adult PT Treatment/Exercise - 09/29/14 0001    Posture/Postural Control   Posture Comments worked on avoiding hyperextension on knees in standing   Knee/Hip Exercises: Stretches   Passive Hamstring Stretch 3 reps;30 seconds  using strap opposite knee bent   Quad Stretch 3 reps;30 seconds  prone   ITB Stretch 3 reps;30 seconds  supine w/ strap hip add across body w/ hip flex/knee ext   Piriformis Stretch 3 reps;20 seconds;30 seconds  supine knee to opposite shoulder   Cryotherapy   Number Minutes  Cryotherapy 12 Minutes   Cryotherapy Location Knee  bilat   Type of Cryotherapy Ice pack             PT Education - 09/29/14 1734    Education provided Yes   Education Details Education re knee rehab; HEP; instruction in standing posture - to avoid hyperextensioin of knees   Person(s) Educated Patient   Methods Explanation;Demonstration;Tactile cues;Verbal cues;Handout   Comprehension Verbalized understanding;Returned demonstration;Verbal cues required;Tactile cues required          PT Short Term Goals - 09/29/14 1754    PT SHORT TERM GOAL #1   Title Patient I in initial HEP(10/27/14)   Time 4   Period Weeks   Status New   PT SHORT TERM GOAL #2   Title Improved quad mobility allowing patiient to achieve 130 degrees knee flexion in prone knee flexion(11/10/14)   Time 6   Period Weeks   Status New   PT SHORT TERM GOAL #3   Title Patient demonstrates/reports standig without hyperextension of knees >80% of time(11/10/14)   Time 6   Period Weeks   Status New           PT Long Term Goals - 09/29/14 1756    PT LONG TERM GOAL #1   Title Patient I in advanced HEP(11/24/14)   Time 8   Period Weeks   Status New   PT LONG TERM GOAL #2   Title Patient demonstrates good flexibility bilat LE's with all stretches(11/24/14)   Time 8   Period Weeks   Status New   PT LONG TERM GOAL #3   Title Progress with appropriate exercise program to improve fitness and avoid irritatioin of knee pain(11/24/14)   Time 8   Period Weeks   PT LONG TERM GOAL #4   Title Will maintain or improve FOTO score(11/24/14)   Time 8   Period Weeks   Status New             Plan - 09/29/14 1741    Clinical Impression Statement Patient presents with bilat knee pain of ~4 months duration. She has had some problems with her knees since bilat foot surgeries but symptoms became much worse/constant in the past 4 months. She has tightness through quads, piformis, hamstrings bilat, Rt > Lt. She  stands with knees hyperextended.   Pt will benefit from skilled therapeutic intervention in order to improve on the following deficits Abnormal gait;Decreased activity tolerance;Decreased strength;Decreased endurance;Difficulty walking;Improper body mechanics;Postural dysfunction;Decreased range of motion   Rehab Potential Excellent   PT Frequency 2x / week   PT Duration  8 weeks   PT Treatment/Interventions Cryotherapy;Electrical Stimulation;Moist Heat;Ultrasound;Gait training;Functional mobility training;Therapeutic activities;Therapeutic exercise;Balance training;Neuromuscular re-education;Patient/family education;Manual techniques;Passive range of motion   PT Next Visit Plan Review exercises; progress with gastroc and soleus stretches; progress with strengthening exercises; work on standing posture and alignment; Progress HEP   PT Home Exercise Plan See HEP   Consulted and Agree with Plan of Care Patient         Problem List Patient Active Problem List   Diagnosis Date Noted  . Primary osteoarthritis of both knees 08/19/2014  . Major depressive disorder, recurrent episode, severe, without mention of psychotic behavior 12/14/2012  . Anxiety disorder 12/14/2012  . Major depressive disorder 11/12/2011    Everardo All, PT, MPH 09/29/2014, 6:08 PM  [facility] [address] [place], [place], [address] Phone: [phone]   Fax:  [phone]   PHYSICAL THERAPY DISCHARGE SUMMARY  Visits from Start of Care: 1  Current functional level related to goals / functional outcomes: Unknown - patient did not return following evaluation   Remaining deficits: Unknown   Education / Equipment: HEP  Plan: Patient agrees to discharge.  Patient goals were not met. Patient is being discharged due to not returning since the last visit.  ?????   Maximilian Tallo P. Helene Kelp, PT, MPH 11/08/14 10:05 am

## 2014-10-06 ENCOUNTER — Ambulatory Visit: Payer: Self-pay | Admitting: Physical Therapy

## 2014-10-06 ENCOUNTER — Encounter: Payer: Self-pay | Admitting: Physical Therapy

## 2014-10-13 ENCOUNTER — Ambulatory Visit: Payer: Self-pay | Admitting: Physical Therapy

## 2014-10-13 ENCOUNTER — Encounter: Payer: Self-pay | Admitting: Rehabilitative and Restorative Service Providers"

## 2014-10-14 ENCOUNTER — Encounter: Payer: Self-pay | Admitting: Physical Therapy

## 2014-11-21 ENCOUNTER — Encounter: Payer: Self-pay | Admitting: Obstetrics and Gynecology

## 2014-11-21 ENCOUNTER — Ambulatory Visit (INDEPENDENT_AMBULATORY_CARE_PROVIDER_SITE_OTHER): Payer: BLUE CROSS/BLUE SHIELD | Admitting: Obstetrics and Gynecology

## 2014-11-21 VITALS — BP 122/76 | HR 80 | Resp 16 | Ht 66.0 in | Wt 218.2 lb

## 2014-11-21 DIAGNOSIS — R32 Unspecified urinary incontinence: Secondary | ICD-10-CM | POA: Diagnosis not present

## 2014-11-21 DIAGNOSIS — E119 Type 2 diabetes mellitus without complications: Secondary | ICD-10-CM

## 2014-11-21 LAB — HEMOGLOBIN A1C
HEMOGLOBIN A1C: 6.5 % — AB (ref <5.7)
Mean Plasma Glucose: 140 mg/dL — ABNORMAL HIGH (ref <117)

## 2014-11-21 NOTE — Progress Notes (Signed)
Patient ID: Deanna Caldwell, female   DOB: [birth year], 54 y.o.   MRN: [id number] GYNECOLOGY  VISIT   HPI: 54 y.o.   Married  [place] American  female   [id number] with No LMP recorded. Patient has had a hysterectomy.  Status post TAH and then laparoscopic BSO. here for urinary incontinence of one year duration.  Wearing a panty liner all the time.  Can leak with cough, sneeze, laugh, brushing teeth if gags.  Can leak for no reason.   DF - every 2 hours.   Drinks a lot of water.  NF - none.  Some enuresis if has dream that she is on the toilet.  Done 3 times a year in the last year.  Voids well.  Can double void at night.   Denies constipation or fecal incontinence.   Post coital UTIs.  Has 3 per year.  Takes Cipro for UTI treatment.   No history of hematuria, stones, pyelonephritis.   AODM.  Diagnosed on physical exam at work.  Hgb A1C is about 6.4 in 2015.  Diet controlled.  Does repetitive lifting of 20 pounds at work.   Referred by Dr. Melinda Crutch.  GYNECOLOGIC HISTORY: No LMP recorded. Patient has had a hysterectomy. Contraception: Hysterectomy Menopausal hormone therapy: Estratest 1.25/2.84m Last mammogram: 08/2014 [facility] Last pap smear:  08/2014 normal         OB History    Gravida Para Term Preterm AB TAB SAB Ectopic Multiple Living   4 3 3  1  1   3          Patient Active Problem List   Diagnosis Date Noted  . Primary osteoarthritis of both knees 08/19/2014  . Major depressive disorder, recurrent episode, severe, without mention of psychotic behavior 12/14/2012  . Anxiety disorder 12/14/2012  . Major depressive disorder 11/12/2011    Past Medical History  Diagnosis Date  . TIA (transient ischemic attack) 10/04/2011  . Hypertension 2007  . Sleep apnea 2014  . UTI (urinary tract infection)   . Endometriosis   . Fibroid   . Anxiety   . Depression   . Diabetes mellitus, type II     12/31/2011--diet  controlled  . Urinary incontinence   . BRCA negative     Past Surgical History  Procedure Laterality Date  . Foot surgery  2010    Removed cartilage from both feet  . Oophorectomy  2007    BSO--due to cysts  . Abdominal hysterectomy  2005    fibroids/endometriosis    Current Outpatient Prescriptions  Medication Sig Dispense Refill  . atorvastatin (LIPITOR) 20 MG tablet Take 20 mg by mouth at bedtime.    . celecoxib (CELEBREX) 200 MG capsule One to 2 tablets by mouth daily as needed for pain. 60 capsule 2  . DULoxetine (CYMBALTA) 60 MG capsule Take 60 mg by mouth daily.    .Marland Kitchenestradiol (ESTRACE) 1 MG tablet Take 1 mg by mouth daily.    . valsartan-hydrochlorothiazide (DIOVAN-HCT) 320-25 MG per tablet Take 1 tablet by mouth daily.    .Marland Kitchenzolpidem (AMBIEN) 10 MG tablet Take 10 mg by mouth daily.    . DULoxetine (CYMBALTA) 60 MG capsule Take 1 capsule (60 mg total) by mouth daily. Take with 30 mg. 60 capsule 1   No current facility-administered medications for this visit.     ALLERGIES: Review of patient's allergies indicates no known allergies.  Family History  Problem Relation  Age of Onset  . Heart attack Mother   . Hypertension Mother   . Cancer Mother   . Stroke Mother   . Breast cancer Mother     dec age 24  . Stroke Father   . Stroke Brother   . Diabetes Brother   . Hypertension Brother   . Diabetes type II Sister 46  . Breast cancer Sister   . Hypertension Sister   . Diabetes type II Paternal Aunt   . Diabetes type II Paternal Uncle   . Miscarriages / [place] Daughter   . Stroke Brother   . Hypertension Brother   . Breast cancer Sister 75  . Diabetes Sister   . Breast cancer Sister 86    metastatic  . Hypertension Sister     History   Social History  . Marital Status: Married    Spouse Name: N/A  . Number of Children: N/A  . Years of Education: N/A   Occupational History  . Not on file.   Social History Main Topics  . Smoking status: Never  Smoker   . Smokeless tobacco: Never Used  . Alcohol Use: 0.0 oz/week    0 Standard drinks or equivalent per week     Comment: occasionally-1 drink every 2 months  . Drug Use: No  . Sexual Activity:    Partners: Male    Birth Control/ Protection: Surgical     Comment: TAH 2005/BSO 2007   Other Topics Concern  . Not on file   Social History Narrative    ROS:  Pertinent items are noted in HPI.  PHYSICAL EXAMINATION:    BP 122/76 mmHg  Pulse 80  Resp 16  Ht 5' 6"  (1.676 m)  Wt 218 lb 3.2 oz (98.975 kg)  BMI 35.24 kg/m2    General appearance: alert, cooperative and appears stated age Head: Normocephalic, without obvious abnormality, atraumatic Lungs: clear to auscultation bilaterally Heart: regular rate and rhythm Abdomen: soft, non-tender; bowel sounds normal; no masses,  no organomegaly Extremities: extremities normal, atraumatic, no cyanosis or edema Skin: Skin color, texture, turgor normal. No rashes or lesions Lymph nodes: Cervical, supraclavicular, and axillary nodes normal. No abnormal inguinal nodes palpated Neurologic: Grossly normal  Pelvic: External genitalia:  no lesions              Urethra:  normal appearing urethra with no masses, tenderness or lesions              Bartholins and Skenes: normal                 Vagina: normal appearing vagina with normal color and discharge, no lesions              Cervix: absent            Bimanual Exam:  Uterus:  uterus absent              Adnexa: normal adnexa and no mass, fullness, tenderness              Rectovaginal: Yes.  .  Confirms.              Anus:  normal sphincter tone, no lesions  Chaperone was present for exam.  ASSESSMENT  Status post TAH.  Status post BSO.  Mixed Urinary incontinence.  On Cymbalta. Hx TIA.  On Estradiol. Diabetes mellitus.  Hx sleep apnea.  FH of breast cancer.  BRCA negative.   PLAN  Counseled regarding urinary incontinence.  Comprehensive discussion  regarding overactive  bladder and genuine stress incontinence - etiologies and treatment options.  Treatment options include medical therapy, physical therapy, pessary use, and surgical midurethral sling or pubovaginal sling for stress incontinence.  Surgical success rates of 85 - 90% for treating genuine stress incontinence were reviewed with the patient.  I discussed midurethral slings and potential for cystotomy, urinary retention, mesh erosion and exposure, UTIs, slower voiding, and de novo urgency and frequency which can be treated with medication.  We also discussed general risks of surgery which include but are not limited to bleeding, infection, damage to surrounding organs, DVT, PE, and death.  I have given patient handouts regarding urinary incontinence and surgical care for incontinence.  I discussed urodynamic testing.  Will check hemoglobin A1C today.   An After Visit Summary was printed and given to the patient.  ___45___ minutes face to face time of which over 50% was spent in counseling.

## 2014-11-22 ENCOUNTER — Telehealth: Payer: Self-pay

## 2014-11-22 NOTE — Telephone Encounter (Signed)
-----   Message from Nunzio Cobbs, MD sent at 11/22/2014 10:22 AM EDT ----- Please contact patient regarding her hemoglobin A1C.  This is now in a diabetic range.  She has been controlling her blood sugar in the past with diet.  I would like to have her see her PCP again regarding this and re-evaluate the need for treatment.  We are planning potential surgery, and her blood sugar level can affect her ability to heal well from surgery.   Thank you so much!  Cc - Marisa Sprinkles

## 2014-11-22 NOTE — Telephone Encounter (Signed)
Left message to call Kaitlyn at [phone]. 

## 2014-11-23 NOTE — Telephone Encounter (Signed)
Spoke with patient. Advised of results as seen below from [place]. Patient is agreeable and will call to schedule an appointment with her PCP. Would like these results faxed to [facility] to Dr.Kistenmaker who is her new PCP. Advised I will fax these results over for her at this time. Hemoglobin A1C faxed to [phone] with cover sheet and confirmation. Patient is requesting to schedule Urodynamics at this time. Advised will let Dr.Silva know so this can be scheduled for her. Patient is agreeable.  Cc: Lamont Snowball, RN Karen Chafe, RN  Routing to provider for final review. Patient agreeable to disposition. Will close encounter.   Patient aware provider will review message and nurse will return call if any additional advice or change of disposition.

## 2014-11-24 ENCOUNTER — Telehealth: Payer: Self-pay | Admitting: *Deleted

## 2014-11-24 NOTE — Telephone Encounter (Signed)
Call to patient to discuss and schedule urodynamic testing. Left message to call back. Ask for Deanna Caldwell.

## 2014-11-24 NOTE — Telephone Encounter (Signed)
Call to patient to discuss urodynamic testing and scheduling. Left message to call back on work number. Mobile number rings and then disconnects X 2 attempts. See next phone encounter.

## 2014-11-25 NOTE — Telephone Encounter (Signed)
Spoke with patient and urodynamics instructions given. Scheduled urodynamics procedure for 12/07/14. Advised to stop all bladder medications one week prior to procedure, patient states she is not on any medications. Arrive with comfortably full bladder.  Advised will need pre procedure UA to check for any infection prior. Scheduled for 12/02/14 Scheduled follow up with Dr. Quincy Simmonds for 12/14/14 Brief description of procedure given.  Patient verbalizes understanding of instructions and agreeable to appointments as scheduled.   cc Kerry Hough  Patient requests that faxed instructions be sent to her via her secure work fax confirmed number [phone] instead of mailed. Fax sent at this time with instructions.

## 2014-12-02 ENCOUNTER — Ambulatory Visit (INDEPENDENT_AMBULATORY_CARE_PROVIDER_SITE_OTHER): Payer: BLUE CROSS/BLUE SHIELD

## 2014-12-02 VITALS — Wt 218.0 lb

## 2014-12-02 DIAGNOSIS — R32 Unspecified urinary incontinence: Secondary | ICD-10-CM

## 2014-12-02 LAB — POCT URINALYSIS DIPSTICK
Bilirubin, UA: NEGATIVE
Blood, UA: NEGATIVE
Glucose, UA: NEGATIVE
Ketones, UA: NEGATIVE
Leukocytes, UA: NEGATIVE
Nitrite, UA: NEGATIVE
Protein, UA: NEGATIVE
Urobilinogen, UA: NEGATIVE
pH, UA: 5

## 2014-12-02 NOTE — Progress Notes (Signed)
Patient stopped by for pre-urodynamics testing. UA was collected.   UA: was Negative. Patient is aware.   Encounter routed to provider.

## 2014-12-04 NOTE — Progress Notes (Signed)
Encounter reviewed by Dr. Aundria Rud. Ok to proceed with urodynamic testing.

## 2014-12-05 ENCOUNTER — Telehealth: Payer: Self-pay | Admitting: Obstetrics and Gynecology

## 2014-12-05 NOTE — Telephone Encounter (Signed)
Spoke with patient. Reviewed benefits for urodynamics. Patient understands and is agreeable. Verified appointment date/time. Patient agreeable. Ok to close.

## 2014-12-05 NOTE — Telephone Encounter (Signed)
Called patient to review benefits for procedure. Left voicemail to call back and review. °

## 2014-12-07 ENCOUNTER — Ambulatory Visit (INDEPENDENT_AMBULATORY_CARE_PROVIDER_SITE_OTHER): Payer: BLUE CROSS/BLUE SHIELD | Admitting: Obstetrics and Gynecology

## 2014-12-07 DIAGNOSIS — R32 Unspecified urinary incontinence: Secondary | ICD-10-CM

## 2014-12-08 ENCOUNTER — Encounter: Payer: Self-pay | Admitting: Obstetrics and Gynecology

## 2014-12-08 NOTE — Progress Notes (Signed)
Deanna Caldwell is a 54 y.o. female Who presents today for urodynamics testing, ordered by Dr. Quincy Simmonds.   Allergies and medications reviewed.  Denies complaints today. No urinary complaints.   Urine Micro exam: negative for WBC's or RBC's, okay to proceed per Dr. Quincy Simmonds.  Patient reports urinary leakage with coughing, sneezing, exercise.   Urodynamics testing initiated. Lumax Bladder Catheter #10 [place] and lumax Abdominal Catheter #10 [place].   Post void residual 20 ml.   Urethral catheter placed without issue. Rectal catheter placed without issue.   Urodynamics testing completed. Please see scanned Patient summary report in Epic. Procedure completed and patient tolerated well without complaints. Patient scheduled for follow up office visit with Dr. Quincy Simmonds to discuss results. Patient agreeable.   Patient given post procedure instructions and verbalized understanding of instructions:  You may have a mild bladder and rectal discomfort for a few hours after the test. You may experience some frequent urination and slight burning the first few times you urinate after the test. Rarely, the urine may be blood tinged. These are both due to catheter placements and resolve quickly. You should call our office immediately if you have signs of infection, which may include bladder pain, urinary urgency, fever, or burning during urination. We do encourage you to drink plenty of water after the test.

## 2014-12-08 NOTE — Progress Notes (Signed)
Late entry for 12/07/14 at 1455

## 2014-12-08 NOTE — Patient Instructions (Signed)
.   You may have a mild bladder and rectal discomfort for a few hours after the test. . You may experience some frequent urination and slight burning the first few times you urinate after the test. Rarely, the urine may be blood tinged. These are both due to catheter placements and resolve quickly.  . You should call our office immediately if you have signs of infection, which may include bladder pain, urinary urgency, fever, or burning during urination. .  We do encourage you to drink plenty of water after completion of the test today.   

## 2014-12-14 ENCOUNTER — Telehealth: Payer: Self-pay | Admitting: Obstetrics and Gynecology

## 2014-12-14 ENCOUNTER — Ambulatory Visit (INDEPENDENT_AMBULATORY_CARE_PROVIDER_SITE_OTHER): Payer: BLUE CROSS/BLUE SHIELD | Admitting: Obstetrics and Gynecology

## 2014-12-14 ENCOUNTER — Encounter: Payer: Self-pay | Admitting: Obstetrics and Gynecology

## 2014-12-14 VITALS — BP 120/82 | HR 70 | Ht 66.0 in | Wt 218.4 lb

## 2014-12-14 DIAGNOSIS — N393 Stress incontinence (female) (male): Secondary | ICD-10-CM | POA: Diagnosis not present

## 2014-12-14 NOTE — Progress Notes (Signed)
Multichannel urodynamic testing  Uroflow - void - 324 cc.  PVR 20 cc.  No flow recorded.  CMG - S1 264 cc, S2 366 cc, S3 655 cc, S4 772 cc.   Max capacity 772 cc.   Lots of artifact on study.  Detrusor contraction noted at the end of the study.  VLPP - 105 cm H20 at 668 cc.  71 cm H20 and 79 cm H20 at max capacity of 763 cc.  UPP - 25 cm H20 at 767 cc.   Pressure flow - Pdet max 66 cm H20. Void 560 cc.   Assessment  Genuine stress incontinence.  Large bladder volumes.

## 2014-12-14 NOTE — Progress Notes (Signed)
Patient ID: Deanna Caldwell, female   DOB: 1961/04/14, 54 y.o.   MRN: [id number] GYNECOLOGY  VISIT   HPI: 54 y.o.   Married  [place] American  female   [phone] with No LMP recorded. Patient has had a hysterectomy.   here for consultation regarding urodynamics testing.   Uroflow - void - 324 cc.  PVR 20 cc.  No flow recorded.  CMG - S1 264 cc, S2 366 cc, S3 655 cc, S4 772 cc.   Max capacity 772 cc.   Lots of artifact on study.  Detrusor contraction noted at the end of the study.  VLPP - 105 cm H20 at 668 cc.  71 cm H20 and 79 cm H20 at max capacity of 763 cc.  UPP - 25 cm H20 at 767 cc.   Pressure flow - Pdet max 66 cm H20. Void 560 cc.   Patient is interested in proceeding with surgery for urinary incontinence.  Wants surgery fro November 2016.  Saw PCP for elevated HgbA1C - was 6.5. No medication prescribed but patient will do more exercise, which she had stopped.  Also is stopping her sugar intake. Will see her PCP 2 weeks prior to surgery and start metformin if it is still elevated. FSBG at home 99 - 101.  States that her urinary incontinence was worse before she restarted her Cymbalta.  Does a lot of lifting at work.  GYNECOLOGIC HISTORY: No LMP recorded. Patient has had a hysterectomy. Contraception:Hysterectomy Menopausal hormone therapy: Estratest 1.25/2.65m Last [facility]: 08[facility]2014 [facility] Last pap smear: 08/2014 normal per patient        OB History    Gravida Para Term Preterm AB TAB SAB Ectopic Multiple Living   4 3 3  1  1   3          Patient Active Problem List   Diagnosis Date Noted  . Primary osteoarthritis of both knees 08/19/2014  . Major depressive disorder, recurrent episode, severe, without mention of psychotic behavior 12/14/2012  . Anxiety disorder 12/14/2012  . Major depressive disorder 11/12/2011    Past Medical History  Diagnosis Date  . TIA (transient ischemic attack) 10/04/2011   . Hypertension 2007  . Sleep apnea 2014  . UTI (urinary tract infection)   . Endometriosis   . Fibroid   . Anxiety   . Depression   . Diabetes mellitus, type II     12/31/2011--diet controlled  . Urinary incontinence   . BRCA negative     Past Surgical History  Procedure Laterality Date  . Foot surgery  2010    Removed cartilage from both feet  . Oophorectomy  2007    BSO--due to cysts  . Abdominal hysterectomy  2005    fibroids/endometriosis    Current Outpatient Prescriptions  Medication Sig Dispense Refill  . atorvastatin (LIPITOR) 20 MG tablet Take 20 mg by mouth at bedtime.    . celecoxib (CELEBREX) 200 MG capsule One to 2 tablets by mouth daily as needed for pain. 60 capsule 2  . DULoxetine (CYMBALTA) 60 MG capsule Take 60 mg by mouth daily.    .Marland Kitchenestradiol (ESTRACE) 1 MG tablet Take 1 mg by mouth daily.    . valsartan-hydrochlorothiazide (DIOVAN-HCT) 320-25 MG per tablet Take 1 tablet by mouth daily.    .Marland Kitchenzolpidem (AMBIEN) 10 MG tablet Take 10 mg by mouth daily.    . DULoxetine (CYMBALTA) 60 MG capsule Take 1 capsule (60  mg total) by mouth daily. Take with 30 mg. 60 capsule 1   No current facility-administered medications for this visit.     ALLERGIES: Review of patient's allergies indicates no known allergies.  Family History  Problem Relation Age of Onset  . Heart attack Mother   . Hypertension Mother   . Cancer Mother   . Stroke Mother   . Breast cancer Mother     dec age 67  . Stroke Father   . Stroke Brother   . Diabetes Brother   . Hypertension Brother   . Diabetes type II Sister 27  . Breast cancer Sister   . Hypertension Sister   . Diabetes type II Paternal Aunt   . Diabetes type II Paternal Uncle   . Miscarriages / [place] Daughter   . Stroke Brother   . Hypertension Brother   . Breast cancer Sister 53  . Diabetes Sister   . Breast cancer Sister 2    metastatic  . Hypertension Sister     Social History   Social History  .  Marital Status: Married    Spouse Name: N/A  . Number of Children: N/A  . Years of Education: N/A   Occupational History  . Not on file.   Social History Main Topics  . Smoking status: Never Smoker   . Smokeless tobacco: Never Used  . Alcohol Use: 0.0 oz/week    0 Standard drinks or equivalent per week     Comment: occasionally-1 drink every 2 months  . Drug Use: No  . Sexual Activity:    Partners: Male    Birth Control/ Protection: Surgical     Comment: TAH 2005/BSO 2007   Other Topics Concern  . Not on file   Social History Narrative    ROS:  Pertinent items are noted in HPI.  PHYSICAL EXAMINATION:    BP 120/82 mmHg  Pulse 70  Ht 5' 6"  (1.676 m)  Wt 218 lb 6.4 oz (99.066 kg)  BMI 35.27 kg/m2    General appearance: alert, cooperative and appears stated age  ASSESSMENT  Genuine stress incontinence. No significant prolapse. Status post TAH.  Status post BSO. Diabetes mellitus.  Diet controlled. On Cymbalta.  PLAN  Counseled regarding urodynamic testing.  Candidate for midurethral sling.  Discussed use of synthetic material to do the sling procedure.  Risks and benefits and alternatives to sling discussed with patient and she wishes to proceed with scheduling of this surgery. Alternative include weight loss, physical therapy, and continued Cymbalta. I encouraged good diabetes control to improve healing and reduce risk of surgically related infection.   Return for preop visit.    An After Visit Summary was printed and given to the patient.  __25____ minutes face to face time of which over 50% was spent in counseling.

## 2014-12-15 NOTE — Telephone Encounter (Signed)
Call to patient. Advised we are aware of request for 02-28-15 date for surgery and are working on precert.  Patient has some concerns regadring recovery time and work requirements. Discussed potential to move date closer to holidays to allow extended recovery time. Advised will confirm with Dr Quincy Simmonds expectations for recovery.  Will call her back with surgery date.

## 2014-12-15 NOTE — Telephone Encounter (Signed)
Patient paid surgery deposit at check out and is requesting her surgery date 02/28/15 if possible. Patient would like a call to confirm. Patient also paid FMLA forms completion fee and forms sent to [employer].

## 2014-12-19 NOTE — Telephone Encounter (Signed)
Reviewed with Dr Quincy Simmonds. Return call to patient.  Questions answered regarding recovery time. 2 weeks out of work them 6 additional weeks of light duty with no lifting over 10 pounds for total of 8 weeks.    Patient elected to move date to December to have husband more available to help with her recovery. Will proceed with scheduling for 04-04-15 per patient request. Surgery consult scheduled for 03-08-15.

## 2014-12-27 NOTE — Telephone Encounter (Signed)
OK for three weeks out of work.  I will need be able to see patient for a release to work visit prior to 04/26/15.

## 2014-12-27 NOTE — Telephone Encounter (Signed)
Call to patient to confirm and finalize surgery details. Left message to call back. Will review surgery instruction sheet and schedule post op appointment. Patient also needs to schedule PCP appointment 2 weeks prior to surgery due to diabetes per Dr Quincy Simmonds.

## 2014-12-27 NOTE — Telephone Encounter (Addendum)
Patient returned call. Surgery date of 04-04-15 at 0730 at [facility] confirmed. Surgery instruction sheet reviewed and printed copy mailed. See scanned copy.  Reminded patient of Dr Elza Rafter instruction that she would need to see PCP again 2 weeks prior to surgery for clearance due to diabetes. Patient is aware and agreeable and states that she has discussed this with PCP    Patient asking for return to work date to be 04-26-15 instead if 04-18-15. Requesting 3 weeks off due to work requirements. Patient would also like to stay overnight in hospital.  Advised Dr Quincy Simmonds will need to advise on these requests. Patient requests call back once Dr Quincy Simmonds reviews.

## 2014-12-29 NOTE — Telephone Encounter (Signed)
Message left to return call to Allie Gerhold at [phone].    

## 2014-12-29 NOTE — Telephone Encounter (Signed)
Reviewed with Dr. Quincy Simmonds.  Schedule patient office visit on 04/26/15 and plan to return to work on 04/27/15.  Message left to return call to [place] at [phone].

## 2014-12-29 NOTE — Telephone Encounter (Signed)
Patient returned call and is notified of message from Dr. Quincy Simmonds.   Attempted to schedule patient for 3 week follow up.  Patient is scheduled for 04/10/15 at 1245 for two week follow up and 05/15/15 at 1430 for 6 week post op.   Advised patient will be contacted if appointment needed to be moved. Patient agreeable.  Message to Ivar Drape for completion of FMLA forms with return to work date 04/26/15.

## 2014-12-29 NOTE — Telephone Encounter (Signed)
Patient scheduled for office visit with Dr. Quincy Simmonds 04/25/14.  Will plan to return to work 04/26/14.  Patient agreeable.   Will close encounter.

## 2015-01-18 ENCOUNTER — Encounter: Payer: Self-pay | Admitting: Obstetrics and Gynecology

## 2015-03-08 ENCOUNTER — Other Ambulatory Visit: Payer: Self-pay

## 2015-03-08 ENCOUNTER — Ambulatory Visit (INDEPENDENT_AMBULATORY_CARE_PROVIDER_SITE_OTHER): Payer: BLUE CROSS/BLUE SHIELD | Admitting: Obstetrics and Gynecology

## 2015-03-08 ENCOUNTER — Encounter: Payer: Self-pay | Admitting: Obstetrics and Gynecology

## 2015-03-08 VITALS — BP 98/60 | HR 64 | Resp 16 | Ht 66.0 in | Wt 217.0 lb

## 2015-03-08 DIAGNOSIS — M170 Bilateral primary osteoarthritis of knee: Secondary | ICD-10-CM

## 2015-03-08 DIAGNOSIS — E119 Type 2 diabetes mellitus without complications: Secondary | ICD-10-CM | POA: Diagnosis not present

## 2015-03-08 DIAGNOSIS — N393 Stress incontinence (female) (male): Secondary | ICD-10-CM

## 2015-03-08 MED ORDER — CELECOXIB 200 MG PO CAPS: ORAL_CAPSULE | ORAL | Status: DC

## 2015-03-08 NOTE — Progress Notes (Signed)
GYNECOLOGY  VISIT   HPI: 54 y.o.   Married  [place] American  female   [phone] with Patient's last menstrual period was 04/23/2003 (approximate).   here for Pre op surgery 04/04/15 TRANSVAGINAL TAPE (TVT) PROCEDURE, CYSTOSCOPY    Daughter and grand-daughter are present in the room for the entire visit.   Still leaking with cough, laugh, sneeze.  Still having sudden jolts of leakage with playing with grandchildren.  Urodynamic testing has confirmed genuine stress incontinence.  Desires surgery for incontinence.   Taking Cymbalta twice a day, and this had lessened her leakage.    Now on Metformin once daily since September 2016 due to elevated hemoglobin A1C of 6.5 on 11/21/14. Highest blood sugar now is 101.  Does a recheck next week with her PCP.  GYNECOLOGIC HISTORY: Patient's last menstrual period was 04/23/2003 (approximate). Contraception: Hysterectomy  Menopausal hormone therapy: Estrace 1 mg daily  Last mammogram: 08/2014 Normal  Last pap smear: 08/27/12 Neg        OB History    Gravida Para Term Preterm AB TAB SAB Ectopic Multiple Living   4 3 3  1  1   3          Patient Active Problem List   Diagnosis Date Noted  . Primary osteoarthritis of both knees 08/19/2014  . Major depressive disorder, recurrent episode, severe, without mention of psychotic behavior 12/14/2012  . Anxiety disorder 12/14/2012  . Major depressive disorder ([place]) 11/12/2011    Past Medical History  Diagnosis Date  . TIA (transient ischemic attack) 10/04/2011  . Hypertension 2007  . Sleep apnea 2014  . UTI (urinary tract infection)   . Endometriosis   . Fibroid   . Anxiety   . Depression   . Diabetes mellitus, type II ([place])     12/31/2011--diet controlled  . Urinary incontinence   . BRCA negative     Past Surgical History  Procedure Laterality Date  . Foot surgery  2010    Removed cartilage from both feet  . Oophorectomy  2007    BSO--due to cysts  . Abdominal hysterectomy  2005   fibroids/endometriosis    Current Outpatient Prescriptions  Medication Sig Dispense Refill  . atorvastatin (LIPITOR) 20 MG tablet Take 20 mg by mouth at bedtime.    . celecoxib (CELEBREX) 200 MG capsule One to 2 tablets by mouth daily as needed for pain. 90 capsule 4  . DULoxetine (CYMBALTA) 30 MG capsule Take 1 capsule by mouth daily.    Marland Kitchen estradiol (ESTRACE) 1 MG tablet Take 1 mg by mouth daily.    . metFORMIN (GLUCOPHAGE) 500 MG tablet Take 1 tablet by mouth daily.  3  . valsartan-hydrochlorothiazide (DIOVAN-HCT) 320-25 MG per tablet Take 1 tablet by mouth daily.    Marland Kitchen zolpidem (AMBIEN) 10 MG tablet Take 10 mg by mouth daily.     No current facility-administered medications for this visit.     ALLERGIES: Review of patient's allergies indicates no known allergies.  Family History  Problem Relation Age of Onset  . Heart attack Mother   . Hypertension Mother   . Cancer Mother   . Stroke Mother   . Breast cancer Mother     dec age 2  . Stroke Father   . Stroke Brother   . Diabetes Brother   . Hypertension Brother   . Diabetes type II Sister 81  . Breast cancer Sister   . Hypertension Sister   . Diabetes type II Paternal Aunt   .  Diabetes type II Paternal Uncle   . Miscarriages / [place] Daughter   . Stroke Brother   . Hypertension Brother   . Breast cancer Sister 26  . Diabetes Sister   . Breast cancer Sister 46    metastatic  . Hypertension Sister     Social History   Social History  . Marital Status: Married    Spouse Name: N/A  . Number of Children: N/A  . Years of Education: N/A   Occupational History  . Not on file.   Social History Main Topics  . Smoking status: Never Smoker   . Smokeless tobacco: Never Used  . Alcohol Use: 0.0 oz/week    0 Standard drinks or equivalent per week     Comment: occasionally-1 drink every 2 months  . Drug Use: No  . Sexual Activity:    Partners: Male    Birth Control/ Protection: Surgical     Comment: TAH  2005/BSO 2007   Other Topics Concern  . Not on file   Social History Narrative    ROS:  Pertinent items are noted in HPI.  PHYSICAL EXAMINATION:    BP 98/60 mmHg  Pulse 64  Resp 16  Ht 5' 6"  (1.676 m)  Wt 217 lb (98.431 kg)  BMI 35.04 kg/m2  LMP 04/23/2003 (Approximate)    General appearance: alert, cooperative and appears stated age Head: Normocephalic, without obvious abnormality, atraumatic Neck: no adenopathy, supple, symmetrical, trachea midline and thyroid normal to inspection and palpation Lungs: clear to auscultation bilaterally Heart: regular rate and rhythm Abdomen: soft, non-tender; bowel sounds normal; no masses,  no organomegaly Extremities: extremities normal, atraumatic, no cyanosis or edema Skin: Skin color, texture, turgor normal. No rashes or lesions Lymph nodes: Cervical, supraclavicular, and axillary nodes normal. No abnormal inguinal nodes palpated Neurologic: Grossly normal  Pelvic: External genitalia:  no lesions              Urethra:  normal appearing urethra with no masses, tenderness or lesions              Bartholins and Skenes: normal                 Vagina: normal appearing vagina with normal color and discharge, no lesions              Cervix: absent              Bimanual Exam:  Uterus:  uterus absent              Adnexa: no mass, fullness, tenderness              Chaperone was present for exam.  ASSESSMENT  Genuine stress incontinence. No significant prolapse. Status post TAH.  Status post BSO. Diabetes mellitus.  On Cymbalta.   PLAN  We discussed benefits and risks of surgery which include but are not limited to bleeding, infection, damage to surrounding organs, ureteral damage, vaginal pain with intercourse, permanent mesh use which may cause erosion and exposure in the vagina, urethra, bladder or ureters, dyspareunia, slower voiding and urinary retention, possible need for prolonged catheterization and/or self catheterization,  de novo overactive bladder symptoms, reoperation, recurrence of prolapse and incontinence,  DVT, PE, death, and reaction to anesthesia.    I have discussed surgical expectations regarding the procedures and success rates, outcomes, and recovery.     Patient wishes to proceed with surgery.   We discussed the importance of good glucose control and how this helps  to reduce infection and promote better healing following surgery.  Continue Metformin.  Follow up with PCP next week for hemoglobin A1C.  An After Visit Summary was printed and given to the patient.  ___15___ minutes face to face time of which over 50% was spent in counseling.

## 2015-03-10 ENCOUNTER — Encounter: Payer: Self-pay | Admitting: Obstetrics and Gynecology

## 2015-03-22 ENCOUNTER — Telehealth: Payer: Self-pay | Admitting: Obstetrics and Gynecology

## 2015-03-22 NOTE — Telephone Encounter (Signed)
Hemoglobin A1C is all that is needed from the PCP.  We will do the rest at her preop at the hospital.

## 2015-03-22 NOTE — Telephone Encounter (Signed)
Mandy from [facility] is calling needing to know what labs the patient needs today to be cleared for surgery. Patient in the office now.

## 2015-03-22 NOTE — Telephone Encounter (Signed)
Patient at PCP office. Per Dr. Elza Rafter note, needs to follow up with PCP for hemoglobin A1C.  Left message for Leafy Ro to return my call and left message with reception what lab test was needed.  Routing to Dr. Quincy Simmonds for any additional instructions.

## 2015-03-23 NOTE — Telephone Encounter (Signed)
Noted message from Dr. Silva.  Will close encounter.   

## 2015-03-26 NOTE — H&P (Signed)
Progress Notes      Nunzio Cobbs, MD at 03/08/2015 2:19 PM     Status: Signed       Expand All Collapse All   GYNECOLOGY VISIT  HPI: 54 y.o. Married [place] American female  [phone] with Patient's last menstrual period was 04/23/2003 (approximate).  here for Pre op surgery 04/04/15 TRANSVAGINAL TAPE (TVT) PROCEDURE, CYSTOSCOPY Daughter and grand-daughter are present in the room for the entire visit.   Still leaking with cough, laugh, sneeze.  Still having sudden jolts of leakage with playing with grandchildren.  Urodynamic testing has confirmed genuine stress incontinence.  Desires surgery for incontinence.   Taking Cymbalta twice a day, and this had lessened her leakage.   Now on Metformin once daily since September 2016 due to elevated hemoglobin A1C of 6.5 on 11/21/14. Highest blood sugar now is 101.  Does a recheck next week with her PCP.  GYNECOLOGIC HISTORY: Patient's last menstrual period was 04/23/2003 (approximate). Contraception: Hysterectomy  Menopausal hormone therapy: Estrace 1 mg daily  Last mammogram: 08/2014 Normal  Last pap smear: 08/27/12 Neg   OB History    Gravida Para Term Preterm AB TAB SAB Ectopic Multiple Living   _0 Patient Active Problem List   Diagnosis Date Noted  . Primary osteoarthritis of both knees 08/19/2014  . Major depressive disorder, recurrent episode, severe, without mention of psychotic behavior 12/14/2012  . Anxiety disorder 12/14/2012  . Major depressive disorder ([place]) 11/12/2011    Past Medical History  Diagnosis Date  . TIA (transient ischemic attack) 10/04/2011  . Hypertension 2007  . Sleep apnea 2014  . UTI (urinary tract infection)   . Endometriosis   . Fibroid   . Anxiety   . Depression   . Diabetes mellitus, type II ([place])     12/31/2011--diet  controlled  . Urinary incontinence   . BRCA negative     Past Surgical History  Procedure Laterality Date  . Foot surgery  2010    Removed cartilage from both feet  . Oophorectomy  2007    BSO--due to cysts  . Abdominal hysterectomy  2005    fibroids/endometriosis    Current Outpatient Prescriptions  Medication Sig Dispense Refill  . atorvastatin (LIPITOR) 20 MG tablet Take 20 mg by mouth at bedtime.    . celecoxib (CELEBREX) 200 MG capsule One to 2 tablets by mouth daily as needed for pain. 90 capsule 4  . DULoxetine (CYMBALTA) 30 MG capsule Take 1 capsule by mouth daily.    Marland Kitchen estradiol (ESTRACE) 1 MG tablet Take 1 mg by mouth daily.    . metFORMIN (GLUCOPHAGE) 500 MG tablet Take 1 tablet by mouth daily.  3  . valsartan-hydrochlorothiazide (DIOVAN-HCT) 320-25 MG per tablet Take 1 tablet by mouth daily.    Marland Kitchen zolpidem (AMBIEN) 10 MG tablet Take 10 mg by mouth daily.     No current facility-administered medications for this visit.     ALLERGIES: Review of patient's allergies indicates no known allergies.  Family History  Problem Relation Age of Onset  . Heart attack Mother   . Hypertension Mother   . Cancer Mother   . Stroke Mother   . Breast cancer Mother     dec age 3  . Stroke Father   . Stroke Brother   . Diabetes Brother   . Hypertension Brother   . Diabetes type II Sister 53  .  Breast cancer Sister   . Hypertension Sister   . Diabetes type II Paternal Aunt   . Diabetes type II Paternal Uncle   . Miscarriages / [place] Daughter   . Stroke Brother   . Hypertension Brother   . Breast cancer Sister 31  . Diabetes Sister   . Breast cancer Sister 67    metastatic  . Hypertension Sister     Social History   Social History  . Marital Status: Married    Spouse Name: N/A   . Number of Children: N/A  . Years of Education: N/A   Occupational History  . Not on file.   Social History Main Topics  . Smoking status: Never Smoker   . Smokeless tobacco: Never Used  . Alcohol Use: 0.0 oz/week    0 Standard drinks or equivalent per week     Comment: occasionally-1 drink every 2 months  . Drug Use: No  . Sexual Activity:    Partners: Male    Birth Control/ Protection: Surgical     Comment: TAH 2005/BSO 2007   Other Topics Concern  . Not on file   Social History Narrative    ROS: Pertinent items are noted in HPI.  PHYSICAL EXAMINATION:   BP 98/60 mmHg  Pulse 64  Resp 16  Ht 5' 6" (1.676 m)  Wt 217 lb (98.431 kg)  BMI 35.04 kg/m2  LMP 04/23/2003 (Approximate)  General appearance: alert, cooperative and appears stated age Head: Normocephalic, without obvious abnormality, atraumatic Neck: no adenopathy, supple, symmetrical, trachea midline and thyroid normal to inspection and palpation Lungs: clear to auscultation bilaterally Heart: regular rate and rhythm Abdomen: soft, non-tender; bowel sounds normal; no masses, no organomegaly Extremities: extremities normal, atraumatic, no cyanosis or edema Skin: Skin color, texture, turgor normal. No rashes or lesions Lymph nodes: Cervical, supraclavicular, and axillary nodes normal. No abnormal inguinal nodes palpated Neurologic: Grossly normal  Pelvic: External genitalia: no lesions  Urethra: normal appearing urethra with no masses, tenderness or lesions  Bartholins and Skenes: normal   Vagina: normal appearing vagina with normal color and discharge, no lesions  Cervix: absent   Bimanual Exam: Uterus: uterus absent  Adnexa: no mass, fullness, tenderness    Chaperone was present for exam.  ASSESSMENT  Genuine stress incontinence. No significant  prolapse. Status post TAH.  Status post BSO. Diabetes mellitus.  On Cymbalta.  PLAN  We discussed benefits and risks of surgery which include but are not limited to bleeding, infection, damage to surrounding organs, ureteral damage, vaginal pain with intercourse, permanent mesh use which may cause erosion and exposure in the vagina, urethra, bladder or ureters, dyspareunia, slower voiding and urinary retention, possible need for prolonged catheterization and/or self catheterization, de novo overactive bladder symptoms, reoperation, recurrence of prolapse and incontinence, DVT, PE, death, and reaction to anesthesia.   I have discussed surgical expectations regarding the procedures and success rates, outcomes, and recovery.   Patient wishes to proceed with surgery.   We discussed the importance of good glucose control and how this helps to reduce infection and promote better healing following surgery.  Continue Metformin.  Follow up with PCP next week for hemoglobin A1C.  An After Visit Summary was printed and given to the patient.  ___15___ minutes face to face time of which over 50% was spent in counseling.

## 2015-03-30 ENCOUNTER — Telehealth: Payer: Self-pay | Admitting: Obstetrics and Gynecology

## 2015-03-30 NOTE — Telephone Encounter (Signed)
Spoke with patient. She is advised that we have not received a copy from her PCP office. Called PCP and they are away for lunch. Patient has a copy and will fax here for review. She states that the [facility] hospital called as well and they need a copy. Advised will scan results.   Hard copy of results received and to Dr. Quincy Simmonds for review.

## 2015-03-30 NOTE — Telephone Encounter (Signed)
Reviewed results in [facility] from 03/22/15. A1C is 6.2.

## 2015-03-30 NOTE — Telephone Encounter (Signed)
Patient checking if records have been received from her pcp's office regarding her [facility] results for surgery.

## 2015-03-30 NOTE — Telephone Encounter (Signed)
I have reviewed the Hemoglobin A1C.  OK to proceed with surgery.  Continue Metformin and low carb diet.   Thank you.

## 2015-03-30 NOTE — Telephone Encounter (Signed)
Lab Results   Hemoglobin A1C (03/22/2015 4:28 PM) Hemoglobin A1C (03/22/2015 4:28 PM)  Component Value Ref Range  HEMOGLOBIN A1C 6.2 (H) <5.7 %  eAG 131 MG/DL  eAG Commetn Comment: The American Diabetes Association has supported the use of estimated Average Glucose calculated from Hemoglobin A1c for the determination of long-term (2-3 month) diabetic control in patients with diabetes. The estimated Average Glucose will likely not match with single daily glucose results and does not reflect sudden changes in daily glucose.    Hemoglobin A1C (03/22/2015 4:28 PM)  Specimen  Blood

## 2015-03-31 NOTE — Telephone Encounter (Signed)
Lab results from PCP were also scanned into EPIC.

## 2015-03-31 NOTE — Telephone Encounter (Signed)
Call to patient and message from Dr. Quincy Simmonds given.  Patient agreeable.  She states she has received a phone call from [facility] and has sent her lab work and EKG to her.  Patient wants to confirm.  Left message with pre-op nurse to return call if information has not been received via fax.   Routing to provider for final review. Patient agreeable to disposition. Will close encounter.   cc Lamont Snowball, RN

## 2015-04-04 ENCOUNTER — Ambulatory Visit (HOSPITAL_COMMUNITY): Payer: BLUE CROSS/BLUE SHIELD | Admitting: Registered Nurse

## 2015-04-04 ENCOUNTER — Ambulatory Visit (HOSPITAL_COMMUNITY)
Admit: 2015-04-04 | Discharge: 2015-04-04 | Disposition: A | Discharge status: Home / Self Care | Payer: BLUE CROSS/BLUE SHIELD | Source: Intra-hospital | Attending: Obstetrics and Gynecology | Admitting: Obstetrics and Gynecology

## 2015-04-04 ENCOUNTER — Encounter (HOSPITAL_COMMUNITY): Payer: Self-pay | Admitting: Registered Nurse

## 2015-04-04 ENCOUNTER — Encounter (HOSPITAL_COMMUNITY)
Disposition: A | Discharge status: Home / Self Care | Payer: Self-pay | Source: Intra-hospital | Attending: Obstetrics and Gynecology

## 2015-04-04 DIAGNOSIS — I10 Essential (primary) hypertension: Secondary | ICD-10-CM | POA: Diagnosis not present

## 2015-04-04 DIAGNOSIS — N393 Stress incontinence (female) (male): Secondary | ICD-10-CM | POA: Diagnosis not present

## 2015-04-04 DIAGNOSIS — E119 Type 2 diabetes mellitus without complications: Secondary | ICD-10-CM | POA: Diagnosis not present

## 2015-04-04 HISTORY — PX: CYSTOSCOPY: SHX5120

## 2015-04-04 HISTORY — PX: BLADDER SUSPENSION: SHX72

## 2015-04-04 LAB — GLUCOSE, CAPILLARY
GLUCOSE-CAPILLARY: 94 mg/dL (ref 65–99)
Glucose-Capillary: 111 mg/dL — ABNORMAL HIGH (ref 65–99)

## 2015-04-04 SURGERY — TRANSVAGINAL TAPE (TVT) PROCEDURE
Anesthesia: General

## 2015-04-04 MED ORDER — ONDANSETRON HCL 4 MG/2ML IJ SOLN
INTRAMUSCULAR | Status: DC | PRN
  Administered 2015-04-04: 4 mg via INTRAVENOUS

## 2015-04-04 MED ORDER — SCOPOLAMINE 1 MG/3DAYS TD PT72
MEDICATED_PATCH | TRANSDERMAL | Status: AC
  Filled 2015-04-04: qty 1

## 2015-04-04 MED ORDER — PROPOFOL 10 MG/ML IV BOLUS
INTRAVENOUS | Status: AC
  Filled 2015-04-04: qty 20

## 2015-04-04 MED ORDER — DEXAMETHASONE SODIUM PHOSPHATE 4 MG/ML IJ SOLN
INTRAMUSCULAR | Status: AC
  Filled 2015-04-04: qty 1

## 2015-04-04 MED ORDER — LACTATED RINGERS IV SOLN
INTRAVENOUS | Status: DC
  Administered 2015-04-04 (×2): via INTRAVENOUS

## 2015-04-04 MED ORDER — LIDOCAINE HCL (CARDIAC) 20 MG/ML IV SOLN
INTRAVENOUS | Status: DC | PRN
  Administered 2015-04-04: 100 mg via INTRAVENOUS

## 2015-04-04 MED ORDER — SCOPOLAMINE 1 MG/3DAYS TD PT72: 1.0000 | MEDICATED_PATCH | Freq: Once | TRANSDERMAL | Status: DC

## 2015-04-04 MED ORDER — FUROSEMIDE 10 MG/ML IJ SOLN
INTRAMUSCULAR | Status: AC
  Filled 2015-04-04: qty 2

## 2015-04-04 MED ORDER — LIDOCAINE-EPINEPHRINE 1 %-1:100000 IJ SOLN
INTRAMUSCULAR | Status: DC | PRN
  Administered 2015-04-04: 3 mL

## 2015-04-04 MED ORDER — CIPROFLOXACIN IN D5W 400 MG/200ML IV SOLN
400.0000 mg | Freq: Two times a day (BID) | INTRAVENOUS | Status: DC
  Administered 2015-04-04: 400 mg via INTRAVENOUS
  Filled 2015-04-04 (×4): qty 200

## 2015-04-04 MED ORDER — KETOROLAC TROMETHAMINE 30 MG/ML IJ SOLN: 30.0000 mg | Freq: Once | INTRAMUSCULAR | Status: DC | PRN

## 2015-04-04 MED ORDER — EPHEDRINE SULFATE 50 MG/ML IJ SOLN
INTRAMUSCULAR | Status: DC | PRN
  Administered 2015-04-04 (×2): 10 mg via INTRAVENOUS
  Administered 2015-04-04: 15 mg via INTRAVENOUS

## 2015-04-04 MED ORDER — OXYCODONE-ACETAMINOPHEN 5-325 MG PO TABS
1.0000 | ORAL_TABLET | Freq: Once | ORAL | Status: AC
  Administered 2015-04-04: 1 via ORAL

## 2015-04-04 MED ORDER — MIDAZOLAM HCL 2 MG/2ML IJ SOLN
INTRAMUSCULAR | Status: AC
  Filled 2015-04-04: qty 2

## 2015-04-04 MED ORDER — LIDOCAINE-EPINEPHRINE 1 %-1:100000 IJ SOLN
INTRAMUSCULAR | Status: AC
  Filled 2015-04-04: qty 1

## 2015-04-04 MED ORDER — KETOROLAC TROMETHAMINE 30 MG/ML IJ SOLN
INTRAMUSCULAR | Status: DC | PRN
  Administered 2015-04-04: 30 mg via INTRAVENOUS

## 2015-04-04 MED ORDER — FENTANYL CITRATE (PF) 250 MCG/5ML IJ SOLN
INTRAMUSCULAR | Status: AC
  Filled 2015-04-04: qty 5

## 2015-04-04 MED ORDER — OXYCODONE-ACETAMINOPHEN 5-325 MG PO TABS
ORAL_TABLET | ORAL | Status: AC
  Filled 2015-04-04: qty 1

## 2015-04-04 MED ORDER — KETOROLAC TROMETHAMINE 30 MG/ML IJ SOLN
INTRAMUSCULAR | Status: AC
  Filled 2015-04-04: qty 1

## 2015-04-04 MED ORDER — LIDOCAINE HCL (CARDIAC) 20 MG/ML IV SOLN
INTRAVENOUS | Status: AC
  Filled 2015-04-04: qty 5

## 2015-04-04 MED ORDER — FENTANYL CITRATE (PF) 100 MCG/2ML IJ SOLN
25.0000 ug | INTRAMUSCULAR | Status: DC | PRN
Start: 2015-04-04 — End: 2015-04-04

## 2015-04-04 MED ORDER — STERILE WATER FOR IRRIGATION IR SOLN
Status: DC | PRN
  Administered 2015-04-04: 2000 mL

## 2015-04-04 MED ORDER — FUROSEMIDE 10 MG/ML IJ SOLN
INTRAMUSCULAR | Status: DC | PRN
  Administered 2015-04-04: 10 mg via INTRAMUSCULAR

## 2015-04-04 MED ORDER — DEXTROSE 10 % IV BOLUS
INTRAVENOUS | Status: DC | PRN
  Administered 2015-04-04: 250 mL via INTRAVENOUS

## 2015-04-04 MED ORDER — PROPOFOL 10 MG/ML IV BOLUS
INTRAVENOUS | Status: DC | PRN
  Administered 2015-04-04: 200 mg via INTRAVENOUS

## 2015-04-04 MED ORDER — FENTANYL CITRATE (PF) 100 MCG/2ML IJ SOLN
INTRAMUSCULAR | Status: DC | PRN
  Administered 2015-04-04 (×3): 50 ug via INTRAVENOUS

## 2015-04-04 MED ORDER — ONDANSETRON HCL 4 MG/2ML IJ SOLN: 4.0000 mg | Freq: Once | INTRAMUSCULAR | Status: DC | PRN

## 2015-04-04 MED ORDER — DEXAMETHASONE SODIUM PHOSPHATE 4 MG/ML IJ SOLN
INTRAMUSCULAR | Status: DC | PRN
  Administered 2015-04-04: 4 mg via INTRAVENOUS

## 2015-04-04 MED ORDER — OXYCODONE-ACETAMINOPHEN 5-325 MG PO TABS: 2.0000 | ORAL_TABLET | ORAL | Status: DC | PRN

## 2015-04-04 MED ORDER — EPHEDRINE 5 MG/ML INJ
INTRAVENOUS | Status: AC
  Filled 2015-04-04: qty 10

## 2015-04-04 MED ORDER — MEPERIDINE HCL 25 MG/ML IJ SOLN: 6.2500 mg | INTRAMUSCULAR | Status: DC | PRN

## 2015-04-04 MED ORDER — ONDANSETRON HCL 4 MG/2ML IJ SOLN
INTRAMUSCULAR | Status: AC
  Filled 2015-04-04: qty 2

## 2015-04-04 MED ORDER — MIDAZOLAM HCL 5 MG/5ML IJ SOLN
INTRAMUSCULAR | Status: DC | PRN
Start: 2015-04-04 — End: 2015-04-04
  Administered 2015-04-04: 2 mg via INTRAVENOUS

## 2015-04-04 SURGICAL SUPPLY — 35 items
BLADE SURG 11 STRL SS (BLADE) ×3 IMPLANT
CANISTER SUCT 3000ML (MISCELLANEOUS) ×3 IMPLANT
CATH FOLEY 2WAY SLVR  5CC 18FR (CATHETERS) ×2
CATH FOLEY 2WAY SLVR 5CC 18FR (CATHETERS) ×1 IMPLANT
CLOTH BEACON ORANGE TIMEOUT ST (SAFETY) ×3 IMPLANT
DECANTER SPIKE VIAL GLASS SM (MISCELLANEOUS) ×3 IMPLANT
GAUZE PACKING 2X5 YD STRL (GAUZE/BANDAGES/DRESSINGS) ×3 IMPLANT
GLOVE BIO SURGEON STRL SZ 6.5 (GLOVE) ×2 IMPLANT
GLOVE BIO SURGEONS STRL SZ 6.5 (GLOVE) ×1
GLOVE BIOGEL PI IND STRL 7.0 (GLOVE) ×1 IMPLANT
GLOVE BIOGEL PI INDICATOR 7.0 (GLOVE) ×2
GOWN STRL REUS W/TWL LRG LVL3 (GOWN DISPOSABLE) ×12 IMPLANT
LIQUID BAND (GAUZE/BANDAGES/DRESSINGS) ×3 IMPLANT
NEEDLE HYPO 22GX1.5 SAFETY (NEEDLE) ×3 IMPLANT
NS IRRIG 1000ML POUR BTL (IV SOLUTION) IMPLANT
PACK VAGINAL MINOR WOMEN LF (CUSTOM PROCEDURE TRAY) ×3 IMPLANT
PACK VAGINAL WOMENS (CUSTOM PROCEDURE TRAY) ×3 IMPLANT
PAD MAGNETIC INST (MISCELLANEOUS) ×3 IMPLANT
PLUG CATH AND CAP STER (CATHETERS) ×3 IMPLANT
SET CYSTO W/LG BORE CLAMP LF (SET/KITS/TRAYS/PACK) ×3 IMPLANT
SLING TVT EXACT (Sling) ×3 IMPLANT
SPONGE SURGIFOAM ABS GEL 12-7 (HEMOSTASIS) IMPLANT
SURGIFLO TRUKIT (HEMOSTASIS) IMPLANT
SUT VIC AB 0 CT1 27 (SUTURE) ×2
SUT VIC AB 0 CT1 27XBRD ANBCTR (SUTURE) ×1 IMPLANT
SUT VIC AB 2-0 CT2 27 (SUTURE) ×3 IMPLANT
SUT VIC AB 2-0 SH 27 (SUTURE) ×6
SUT VIC AB 2-0 SH 27XBRD (SUTURE) ×3 IMPLANT
SUT VIC AB 4-0 PS2 27 (SUTURE) ×3 IMPLANT
TOWEL OR 17X24 6PK STRL BLUE (TOWEL DISPOSABLE) ×6 IMPLANT
TRAY FOLEY BAG SILVER LF 16FR (SET/KITS/TRAYS/PACK) IMPLANT
TRAY FOLEY CATH SILVER 14FR (SET/KITS/TRAYS/PACK) IMPLANT
TUBING NON-CON 1/4 X 20 CONN (TUBING) ×2 IMPLANT
TUBING NON-CON 1/4 X 20' CONN (TUBING) ×1
WATER STERILE IRR 1000ML POUR (IV SOLUTION) ×3 IMPLANT

## 2015-04-04 NOTE — Brief Op Note (Signed)
04/04/2015  9:02 AM  PATIENT:  Deanna Caldwell  54 y.o. female  PRE-OPERATIVE DIAGNOSIS:  genuine stress incontinence  POST-OPERATIVE DIAGNOSIS:  genuine stress incontinence  PROCEDURE:  Procedure(s) with comments: TRANSVAGINAL TAPE (TVT) PROCEDURE (N/A) - 1.25 hours CYSTOSCOPY (N/A)  SURGEON:  Surgeon(s) and Role:    * Brook Oletta Lamas, MD - Primary    * Salvadore Dom, MD - Assisting  PHYSICIAN ASSISTANT:   NA  ASSISTANTS: Salvadore Dom, MD   ANESTHESIA:   local and LMA.  EBL:  Total I/O In: 1300 [I.V.:1300] Out: 435 [Urine:425; Blood:10]  BLOOD ADMINISTERED:none  DRAINS: none   LOCAL MEDICATIONS USED:  LIDOCAINE   SPECIMEN:  No Specimen  DISPOSITION OF SPECIMEN:  N/A  COUNTS:  YES  TOURNIQUET:  * No tourniquets in log *  DICTATION: .Other Dictation: Dictation Number    PLAN OF CARE: Discharge to home after PACU  PATIENT DISPOSITION:  PACU - hemodynamically stable.   Delay start of Pharmacological VTE agent (>24hrs) due to surgical blood loss or risk of bleeding: not applicable

## 2015-04-04 NOTE — Anesthesia Preprocedure Evaluation (Addendum)
Anesthesia Evaluation  Patient identified by MRN, date of birth, ID band Patient awake    Reviewed: Allergy & Precautions, H&P , NPO status , Patient's Chart, lab work & pertinent test results  Airway Mallampati: I  TM Distance: >3 FB Neck ROM: full    Dental no notable dental hx. (+) Teeth Intact   Pulmonary    Pulmonary exam normal        Cardiovascular hypertension, Pt. on medications negative cardio ROS Normal cardiovascular exam     Neuro/Psych    GI/Hepatic negative GI ROS, Neg liver ROS,   Endo/Other  diabetes  Renal/GU negative Renal ROS     Musculoskeletal   Abdominal Normal abdominal exam  (+)   Peds  Hematology negative hematology ROS (+)   Anesthesia Other Findings   Reproductive/Obstetrics negative OB ROS                            Anesthesia Physical Anesthesia Plan  ASA: II  Anesthesia Plan: General   Post-op Pain Management:    Induction: Intravenous  Airway Management Planned: LMA  Additional Equipment:   Intra-op Plan:   Post-operative Plan: Extubation in OR  Informed Consent: I have reviewed the patients History and Physical, chart, labs and discussed the procedure including the risks, benefits and alternatives for the proposed anesthesia with the patient or authorized representative who has indicated his/her understanding and acceptance.     Plan Discussed with: CRNA and Surgeon  Anesthesia Plan Comments:        Anesthesia Quick Evaluation

## 2015-04-04 NOTE — Progress Notes (Signed)
Update to History and Physical  No marked change in status since office pre-op visit.   Patient examined.   OK to proceed with surgery. 

## 2015-04-04 NOTE — Transfer of Care (Signed)
Immediate Anesthesia Transfer of Care Note  Patient: Deanna Caldwell  Procedure(s) Performed: Procedure(s) with comments: TRANSVAGINAL TAPE (TVT) PROCEDURE (N/A) - 1.25 hours CYSTOSCOPY (N/A)  Patient Location: PACU  Anesthesia Type:General  Level of Consciousness: awake, alert  and oriented  Airway & Oxygen Therapy: Patient Spontanous Breathing and Patient connected to nasal cannula oxygen  Post-op Assessment: Report given to RN  Post vital signs: Reviewed  Last Vitals:  Filed Vitals:   04/04/15 0643  BP: 133/93  Pulse: 83  Temp: 36.8 C  Resp: 20    Complications: No apparent anesthesia complications

## 2015-04-04 NOTE — Anesthesia Procedure Notes (Signed)
Procedure Name: LMA Insertion Date/Time: 04/04/2015 7:21 AM Performed by: Talbot Grumbling Pre-anesthesia Checklist: Patient identified, Emergency Drugs available, Suction available and Patient being monitored Patient Re-evaluated:Patient Re-evaluated prior to inductionOxygen Delivery Method: Circle system utilized Preoxygenation: Pre-oxygenation with 100% oxygen Intubation Type: IV induction Ventilation: Mask ventilation without difficulty LMA: LMA inserted LMA Size: 4.0 Number of attempts: 1 Placement Confirmation: positive ETCO2 and breath sounds checked- equal and bilateral Tube secured with: Tape Dental Injury: Teeth and Oropharynx as per pre-operative assessment

## 2015-04-04 NOTE — Anesthesia Postprocedure Evaluation (Signed)
Anesthesia Post Note  Patient: Deanna Caldwell  Procedure(s) Performed: Procedure(s) (LRB): TRANSVAGINAL TAPE (TVT) PROCEDURE (N/A) CYSTOSCOPY (N/A)  Patient location during evaluation: PACU Anesthesia Type: General Level of consciousness: awake Pain management: pain level controlled Vital Signs Assessment: post-procedure vital signs reviewed and stable Respiratory status: spontaneous breathing Cardiovascular status: stable Postop Assessment: no signs of nausea or vomiting and adequate PO intake Anesthetic complications: no    Last Vitals:  Filed Vitals:   04/04/15 1000 04/04/15 1100  BP: 102/67 132/74  Pulse: 76 80  Temp:  36.7 C  Resp: 11 16    Last Pain:  Filed Vitals:   04/04/15 1121  PainSc: [place]

## 2015-04-04 NOTE — Discharge Instructions (Signed)
Urethral Vaginal Sling, Care After Refer to this sheet in the next few weeks. These instructions provide you with information on caring for yourself after your procedure. Your health care provider may also give you more specific instructions. Your treatment has been planned according to current medical practices, but problems sometimes occur. Call your health care provider if you have any problems or questions after your procedure.  WHAT TO EXPECT AFTER THE PROCEDURE  After your procedure, it is typical to have the following:  A catheter in your bladder until your bladder is able to work on its own properly. You will be instructed on how to empty the catheter bag.  Absorbable stitches in your incisions. They will slowly dissolve over 1-2 months. HOME CARE INSTRUCTIONS  Get plenty of rest.  Only take over-the-counter or prescription medicines as directed by your health care provider. Do not take aspirin because it can cause bleeding.  Do not take baths. Take showers until your health care provider tells you otherwise.  You may resume your usual diet. Eat a well-balanced diet.  Drink enough fluids to keep your urine clear or pale yellow.  Limit exercise and activities as directed by your health care provider. Do not lift anything heavier than 5 pounds (2.3 kg).  Do not douche, use tampons, or have sexual intercourse for 6 weeks after your procedure.  Follow up with your health care provider as directed. SEEK MEDICAL CARE IF:  You have a heavy or bad smelling vaginal discharge.   You have a rash.   You have pain that is not controlled with medicines.   You have lightheadedness or feel faint.  SEEK IMMEDIATE MEDICAL CARE IF:  You have a fever.   You have vaginal bleeding.   You faint.   You have shortness of breath.   You have chest, abdominal, or leg pain.   You have pain when urinating or cannot urinate.   Your catheter is still in your bladder and becomes  blocked.   You have swelling, redness, and pain in the vaginal area.    This information is not intended to replace advice given to you by your health care provider. Make sure you discuss any questions you have with your health care provider.   Document Released: 01/27/2013 Document Reviewed: 01/27/2013 Elsevier Interactive Patient Education 2016 [place] [facility] Home Care Instructions  Activity: Get plenty of rest for the remainder of the day. A responsible adult should stay with you for 24 hours following the procedure.  For the next 24 hours, DO NOT: -Drive a car -Paediatric nurse -Drink alcoholic beverages -Take any medication unless instructed by your physician -Make any legal decisions or sign important papers.  Meals: Start with liquid foods such as gelatin or soup. Progress to regular foods as tolerated. Avoid greasy, spicy, heavy foods. If nausea and/or vomiting occur, drink only clear liquids until the nausea and/or vomiting subsides. Call your physician if vomiting continues.  Special Instructions/Symptoms: Your throat may feel dry or sore from the anesthesia or the breathing tube placed in your throat during surgery. If this causes discomfort, gargle with warm salt water. The discomfort should disappear within 24 hours.  If you had a scopolamine patch placed behind your ear for the management of post- operative nausea and/or vomiting:  1. The medication in the patch is effective for 72 hours, after which it should be removed.  Wrap patch in a tissue and discard in the trash. Wash hands thoroughly with  soap and water. 2. You may remove the patch earlier than 72 hours if you experience unpleasant side effects which may include dry mouth, dizziness or visual disturbances. 3. Avoid touching the patch. Wash your hands with soap and water after contact with the patch.

## 2015-04-05 ENCOUNTER — Encounter (HOSPITAL_COMMUNITY): Payer: Self-pay | Admitting: Obstetrics and Gynecology

## 2015-04-05 NOTE — Op Note (Signed)
Deanna Caldwell, Deanna Caldwell               ACCOUNT NO.:  [id number]  MEDICAL RECORD NO.:  [id number]  LOCATION:  [facility]                          FACILITY:  [facility]  PHYSICIAN:  Lenard Galloway, M.D.   DATE OF BIRTH:  24-Feb-1961  DATE OF PROCEDURE:  04/04/2015 DATE OF DISCHARGE:  04/04/2015                              OPERATIVE REPORT   PREOPERATIVE DIAGNOSIS:  Genuine stress incontinence.  POSTOPERATIVE DIAGNOSIS:  Genuine stress incontinence.  PROCEDURE:  TVT Exact midurethral sling with cystoscopy.  SURGEON:  Lenard Galloway, M.D.  ASSISTANT:  Sumner Boast, M.D.  ANESTHESIA:  LMA, local with 1% lidocaine with epinephrine 1:100,000.  IV FLUIDS:  1300 mL Ringer's lactate.  ESTIMATED BLOOD LOSS:  10 mL.  URINE OUTPUT:  425 mL.  COMPLICATIONS:  None.  INDICATIONS FOR THE PROCEDURE:  The patient is a 54 year old gravida 4, para 3-0-1-3 African American female, status post total abdominal hysterectomy and status post bilateral salpingo-oophorectomy, who presents with urinary incontinence with coughing, laughing, sneezing, and physical activity.  The patient has had urodynamic testing in the office, which confirmed the presence of genuine stress incontinence. She desires surgical treatment for incontinence and a plan is made to proceed with a TVT Exact midurethral sling and cystoscopy after risks, benefits, and alternatives are reviewed.  FINDINGS:  Examination under anesthesia revealed an absent cervix. There was good anterior and apical, and posterior vaginal wall support.  Cystoscopy with placement of the sling and after placement of the sling documented the bladder to be normal throughout 360 degrees including the bladder dome and trigone.  There was no evidence of a foreign body in the bladder of the urethra.  At the completion of the procedure, patency of the ureters was documented bilaterally.  SPECIMEN:  None.  OF PROCEDURE:  The patient was reidentified in the preoperative  hold area.  She did receive ciprofloxacin 400 mg IV for antibiotic prophylaxis and TED hose and PAS stockings for DVT prophylaxis.  In the operating room, the patient was placed in the dorsal lithotomy position with the padded stirrups.  The patient received an LMA anesthetic.  The lower abdomen, vagina and perineum were then sterilely prepped and she was draped.  The bladder was catheterized of urine and the catheter was left in place.  An examination of the patient's pelvic floor was performed at this time. The findings were as noted above.  A weighted speculum was placed inside the vagina.  Allis scissors were used to mark the anterior vaginal mucosa over a distance of 3 cm.  The mucosa was injected locally with 1% lidocaine with epinephrine 1:100,000.  The mucosa was then incised vertically in the midline with a scalpel.  A combination of sharp and blunt dissection were used to dissect this of vaginal tissue away from the vaginal mucosa.  The dissection was carried back to the level of the pubic rami.  Hemostasis was good.  At this time, the Foley catheter tip was removed and the obturator guide with the Foley tip was placed inside the bladder.  The TVT Exact was performed in a bottom up fashion.  The 1 cm suprapubic incisions were created to the right and left  of the midline.  The TVT was placed through the right retropubic space and then up through the right suprapubic incision on the ipsilateral side.  The urethra was deflected out of the way during this procedure.  The obturator guide and Foley tip were then brought in the other direction and the TVT with the introducer guide was brought through the left retropubic space and up through the left suprapubic ipsilateral incision.  The Foley tip and obturator were removed at this time.  Cystoscopy was performed.  There was no evidence of a foreign body in the bladder of the urethra.  The ureters were not patent at this time.  At  this time, the bladder was emptied of urine. The sling was brought up through the suprapubic incisions.  A Kelly clamp was placed between the urethra and the sling, and the plastic sheaths were removed.  The sling was noted to be in good position. Excess sling was trimmed suprapubically.  Cystoscopy was once again performed and at this time, the ureters did not document patency.  The patient therefore received 10 mg of Lasix IV. The bladder was again drained of all cystoscopic fluid.  The anterior vaginal mucosa was closed with a running lock suture of 2-0 Vicryl.  Final cystoscopy was performed at this time.  D10W was used to perform the cystoscopy.  Patency of both of the ureters was documented.  The bladder was drained of all cystoscopic fluid at this time.  The suprapubic incisions were closed with subcuticular sutures of 4-0 Vicryl.  Dermabond was placed over the incisions.  Hemostasis was good vaginally.  The patient's Foley catheter was left out at the termination of the procedure.  The patient was awakened and escorted to the recovery room in stable condition.  There were no complications to the procedure.  All needle, instrument, and sponge counts were correct.     Lenard Galloway, M.D.     BES/MEDQ  D:  04/04/2015  T:  04/05/2015  Job:  [id number]

## 2015-04-06 ENCOUNTER — Telehealth: Payer: Self-pay | Admitting: Obstetrics and Gynecology

## 2015-04-06 NOTE — Telephone Encounter (Signed)
Patient would like to speak with nurse. Had surgery on Monday.

## 2015-04-06 NOTE — Telephone Encounter (Signed)
Spoke with patient. Patient states that she received a phone call from her job stating she is to return to work on 12/26. Patient states that based on the paper work from [place] she is not cleared to return to work until 04/26/2015. Patient is requesting assistance with date extension. Advised I will send a message over to our insurance and billing department that help with FMLA who will return call with further information.

## 2015-04-10 ENCOUNTER — Encounter: Payer: Self-pay | Admitting: Obstetrics and Gynecology

## 2015-04-10 ENCOUNTER — Ambulatory Visit (INDEPENDENT_AMBULATORY_CARE_PROVIDER_SITE_OTHER): Payer: BLUE CROSS/BLUE SHIELD | Admitting: Obstetrics and Gynecology

## 2015-04-10 VITALS — BP 104/64 | HR 80 | Ht 66.0 in | Wt 216.4 lb

## 2015-04-10 DIAGNOSIS — Z9889 Other specified postprocedural states: Secondary | ICD-10-CM

## 2015-04-10 NOTE — Progress Notes (Signed)
Patient ID: Deanna Caldwell, female   DOB: [birth year], 54 y.o.   MRN: [id number] GYNECOLOGY  VISIT   HPI: 54 y.o.   Married  [place] American  female   [phone] with Patient's last menstrual period was 04/23/2003 (approximate).   here for 1 weeks follow up  TRANSVAGINAL TAPE (TVT) PROCEDURE (N/A )  CYSTOSCOPY (N/A ).   Voiding well.  Not leaking urine.   Some left sided discomfort.  No narcotic use since Wednesday.   Las BM was yesterday.   Some relief with Bm yesterday.  Regular diet.   Some vaginal bleeding with cough or wiping.  No clotting.   Works at [employer].  Was supposed to be out of work until after the [employer]. Employer is now stating she is supposed to return to work on 12/26. Previous paper work from this office states otherwise.   GYNECOLOGIC HISTORY: Patient's last menstrual period was 04/23/2003 (approximate). Contraception:Hysterectomy Menopausal hormone therapy: Estrace 32m daily Last mammogram: 08/2014 normal Last pap smear: 08-27-12 Neg        OB History    Gravida Para Term Preterm AB TAB SAB Ectopic Multiple Living   4 3 3  1  1   3          Patient Active Problem List   Diagnosis Date Noted  . Primary osteoarthritis of both knees 08/19/2014  . Major depressive disorder, recurrent episode, severe, without mention of psychotic behavior 12/14/2012  . Anxiety disorder 12/14/2012  . Major depressive disorder ([facility] 11/12/2011    Past Medical History  Diagnosis Date  . TIA (transient ischemic attack) 10/04/2011  . Hypertension 2007  . Sleep apnea 2014  . UTI (urinary tract infection)   . Endometriosis   . Fibroid   . Anxiety   . Depression   . Diabetes mellitus, type II ([place]     12/31/2011--diet controlled  . Urinary incontinence   . BRCA negative     Past Surgical History  Procedure Laterality Date  . Foot surgery  2010    Removed cartilage from both feet  . Oophorectomy  2007    BSO--due to cysts  . Abdominal hysterectomy  2005     fibroids/endometriosis  . Bladder suspension N/A 04/04/2015    Procedure: TRANSVAGINAL TAPE (TVT) PROCEDURE;  Surgeon: BNunzio Cobbs MD;  [facility];  Service: Gynecology;  Laterality: N/A;  1.25 hours  . Cystoscopy N/A 04/04/2015    Procedure: CYSTOSCOPY;  Surgeon: BNunzio Cobbs MD;  [facility];  Service: Gynecology;  Laterality: N/A;    Current Outpatient Prescriptions  Medication Sig Dispense Refill  . atorvastatin (LIPITOR) 20 MG tablet Take 20 mg by mouth at bedtime.    . celecoxib (CELEBREX) 200 MG capsule One to 2 tablets by mouth daily as needed for pain. (Patient taking differently: Take 200-400 mg by mouth daily as needed for moderate pain. One to 2 tablets by mouth daily as needed for pain.) 90 capsule 4  . DULoxetine (CYMBALTA) 30 MG capsule Take 1 capsule by mouth daily.    .Marland Kitchenestradiol (ESTRACE) 1 MG tablet Take 1 mg by mouth daily.    . metFORMIN (GLUCOPHAGE) 500 MG tablet Take 500 mg by mouth daily.   3  . oxyCODONE-acetaminophen (PERCOCET) 5-325 MG tablet Take 2 tablets by mouth every 4 (four) hours as needed. use only as much as needed to relieve pain 30 tablet 0  . valsartan-hydrochlorothiazide (DIOVAN-HCT) 320-25 MG per tablet Take  1 tablet by mouth daily.    Marland Kitchen zolpidem (AMBIEN) 10 MG tablet Take 10 mg by mouth at bedtime as needed for sleep.      No current facility-administered medications for this visit.     ALLERGIES: Review of patient's allergies indicates no known allergies.  Family History  Problem Relation Age of Onset  . Heart attack Mother   . Hypertension Mother   . Cancer Mother   . Stroke Mother   . Breast cancer Mother     dec age 58  . Stroke Father   . Stroke Brother   . Diabetes Brother   . Hypertension Brother   . Diabetes type II Sister 67  . Breast cancer Sister   . Hypertension Sister   . Diabetes type II Paternal Aunt   . Diabetes type II Paternal Uncle   . Miscarriages / [place] Daughter   .  Stroke Brother   . Hypertension Brother   . Breast cancer Sister 68  . Diabetes Sister   . Breast cancer Sister 60    metastatic  . Hypertension Sister     Social History   Social History  . Marital Status: Married    Spouse Name: N/A  . Number of Children: N/A  . Years of Education: N/A   Occupational History  . Not on file.   Social History Main Topics  . Smoking status: Never Smoker   . Smokeless tobacco: Never Used  . Alcohol Use: 0.0 oz/week    0 Standard drinks or equivalent per week     Comment: occasionally-1 drink every 2 months  . Drug Use: No  . Sexual Activity:    Partners: Male    Birth Control/ Protection: Surgical     Comment: TAH 2005/BSO 2007   Other Topics Concern  . Not on file   Social History Narrative    ROS:  Pertinent items are noted in HPI.  PHYSICAL EXAMINATION:    BP 104/64 mmHg  Pulse 80  Ht 5' 6"  (1.676 m)  Wt 216 lb 6.4 oz (98.158 kg)  BMI 34.94 kg/m2  LMP 04/23/2003 (Approximate)    General appearance: alert, cooperative and appears stated age    Pelvic: External genitalia:  Suprapubic incisions. Intact.               Urethra:  normal appearing urethra with no masses, tenderness or lesions              Bartholins and Skenes: normal                 Vagina:  Vaginal suture line intact with mildly bloody mucous. Sling protected.               Cervix: absent   Bimanual Exam:  Uterus:   absent              Adnexa: no mass, fullness, tenderness        Chaperone was present for exam.  ASSESSMENT  Status post midurethral sling/cystoscopy.  Doing well overall.  Not ready to return to work.   PLAN  Shower for the next 2 weeks instead of bathing in tub. Counseled regarding continued decreased activity. No lifting, carrying, hauling over 10 pounds and no sexual activity for 8 weeks.   Return for next check on 04/26/15, at which time patient will be assessed for return to work.   An After Visit Summary was printed and given  to the patient.

## 2015-04-26 ENCOUNTER — Encounter: Payer: Self-pay | Admitting: Obstetrics and Gynecology

## 2015-04-26 ENCOUNTER — Ambulatory Visit (INDEPENDENT_AMBULATORY_CARE_PROVIDER_SITE_OTHER): Payer: BLUE CROSS/BLUE SHIELD | Admitting: Obstetrics and Gynecology

## 2015-04-26 VITALS — BP 100/60 | HR 70 | Resp 16 | Ht 66.0 in | Wt 219.0 lb

## 2015-04-26 DIAGNOSIS — IMO0001 Reserved for inherently not codable concepts without codable children: Secondary | ICD-10-CM

## 2015-04-26 DIAGNOSIS — Z9889 Other specified postprocedural states: Secondary | ICD-10-CM

## 2015-04-26 DIAGNOSIS — T814XXA Infection following a procedure, initial encounter: Secondary | ICD-10-CM

## 2015-04-26 NOTE — Progress Notes (Signed)
GYNECOLOGY  VISIT   HPI: 55 y.o.   Married  [place] American  female   [phone] with Patient's last menstrual period was 04/23/2003 (approximate).   here for Post op from 04/04/15 TRANSVAGINAL TAPE (TVT) PROCEDURE (N/A ), CYSTOSCOPY   Voiding well.  Still some mild bleeding with straining.  No pain with urination.  SP incisions are pulling slightly.   Supposed to return to work tomorrow.   GYNECOLOGIC HISTORY: Patient's last menstrual period was 04/23/2003 (approximate). Contraception: Hysterectomy  Menopausal hormone therapy: estrace 71m daily Last mammogram: 08/2014 Normal  Last pap smear: 08/27/12 Neg        OB History    Gravida Para Term Preterm AB TAB SAB Ectopic Multiple Living   _0 Patient Active Problem List   Diagnosis Date Noted  . Primary osteoarthritis of both knees 08/19/2014  . Major depressive disorder, recurrent episode, severe, without mention of psychotic behavior 12/14/2012  . Anxiety disorder 12/14/2012  . Major depressive disorder ([place] 11/12/2011    Past Medical History  Diagnosis Date  . TIA (transient ischemic attack) 10/04/2011  . Hypertension 2007  . Sleep apnea 2014  . UTI (urinary tract infection)   . Endometriosis   . Fibroid   . Anxiety   . Depression   . Diabetes mellitus, type II ([facility]     12/31/2011--diet controlled  . Urinary incontinence   . BRCA negative     Past Surgical History  Procedure Laterality Date  . Foot surgery  2010    Removed cartilage from both feet  . Oophorectomy  2007    BSO--due to cysts  . Abdominal hysterectomy  2005    fibroids/endometriosis  . Bladder suspension N/A 04/04/2015    Procedure: TRANSVAGINAL TAPE (TVT) PROCEDURE;  Surgeon: BNunzio Cobbs MD;  [facility];  Service: Gynecology;  Laterality: N/A;  1.25 hours  . Cystoscopy N/A 04/04/2015    Procedure: CYSTOSCOPY;  Surgeon: BNunzio Cobbs MD;  [facility];  Service: Gynecology;  Laterality:  N/A;    Current Outpatient Prescriptions  Medication Sig Dispense Refill  . atorvastatin (LIPITOR) 20 MG tablet Take 20 mg by mouth at bedtime.    . celecoxib (CELEBREX) 200 MG capsule One to 2 tablets by mouth daily as needed for pain. (Patient taking differently: Take 200-400 mg by mouth daily as needed for moderate pain. One to 2 tablets by mouth daily as needed for pain.) 90 capsule 4  . DULoxetine (CYMBALTA) 30 MG capsule Take 1 capsule by mouth daily.    .Marland Kitchenestradiol (ESTRACE) 1 MG tablet Take 1 mg by mouth daily.    . metFORMIN (GLUCOPHAGE) 500 MG tablet Take 500 mg by mouth daily.   3  . valsartan-hydrochlorothiazide (DIOVAN-HCT) 320-25 MG per tablet Take 1 tablet by mouth daily.    .Marland Kitchenzolpidem (AMBIEN) 10 MG tablet Take 10 mg by mouth at bedtime as needed for sleep.      No current facility-administered medications for this visit.     ALLERGIES: Review of patient's allergies indicates no known allergies.  Family History  Problem Relation Age of Onset  . Heart attack Mother   . Hypertension Mother   . Cancer Mother   . Stroke Mother   . Breast cancer Mother     dec age 53 . Stroke Father   . Stroke Brother   . Diabetes  Brother   . Hypertension Brother   . Diabetes type II Sister 79  . Breast cancer Sister   . Hypertension Sister   . Diabetes type II Paternal Aunt   . Diabetes type II Paternal Uncle   . Miscarriages / [place] Daughter   . Stroke Brother   . Hypertension Brother   . Breast cancer Sister 46  . Diabetes Sister   . Breast cancer Sister 34    metastatic  . Hypertension Sister     Social History   Social History  . Marital Status: Married    Spouse Name: N/A  . Number of Children: N/A  . Years of Education: N/A   Occupational History  . Not on file.   Social History Main Topics  . Smoking status: Never Smoker   . Smokeless tobacco: Never Used  . Alcohol Use: 0.0 oz/week    0 Standard drinks or equivalent per week     Comment:  occasionally-1 drink every 2 months  . Drug Use: No  . Sexual Activity:    Partners: Male    Birth Control/ Protection: Surgical     Comment: TAH 2005/BSO 2007   Other Topics Concern  . Not on file   Social History Narrative    ROS:  Pertinent items are noted in HPI.  PHYSICAL EXAMINATION:    BP 100/60 mmHg  Pulse 70  Resp 16  Ht _0  (1.676 m)  Wt 219 lb (99.338 kg)  BMI 35.36 kg/m2  LMP 04/23/2003 (Approximate)    General appearance: alert, cooperative and appears stated age  Pelvic: External genitalia:   Left suprapubic incision slightly raised and firm.  Very small area involved  - 3 - 4 mm.  Area compressed and small amount of pus relieved from the area.  Hair follicle also here. Cleansed with alcohol swab.               Urethra:  normal appearing urethra with no masses, tenderness or lesions              Bartholins and Skenes: normal                 Vagina: anterior vaginal suture line intact.  Sutures starting to loosen. Sling protected.               Cervix: absent    Bimanual Exam:  Uterus:  uterus absent              Adnexa: no mass, fullness, tenderness               Chaperone was present for exam.  ASSESSMENT  Status post TVT midurethral sling.  Doing well.  Small suture abscess - drained by me today.   PLAN  Counseled regarding stitch abscess vs. Folliculitis.  Cleanse daily with H2O2 or rubbing alcohol.  Call for increase in size.  Return to work tomorrow with lifting restrictions.  Letter written.  Keep final 6 wk post op appointment. An After Visit Summary was printed and given to the patient.  __15____ minutes face to face time of which over 50% was spent in counseling.

## 2015-05-01 ENCOUNTER — Ambulatory Visit (INDEPENDENT_AMBULATORY_CARE_PROVIDER_SITE_OTHER): Payer: BLUE CROSS/BLUE SHIELD | Admitting: Obstetrics and Gynecology

## 2015-05-01 ENCOUNTER — Encounter: Payer: Self-pay | Admitting: Obstetrics and Gynecology

## 2015-05-01 VITALS — BP 130/72 | HR 80 | Ht 66.0 in | Wt 223.0 lb

## 2015-05-01 DIAGNOSIS — Z9189 Other specified personal risk factors, not elsewhere classified: Secondary | ICD-10-CM | POA: Diagnosis not present

## 2015-05-01 DIAGNOSIS — Z9181 History of falling: Secondary | ICD-10-CM

## 2015-05-01 MED ORDER — ESTRADIOL 0.1 MG/GM VA CREA: TOPICAL_CREAM | VAGINAL | Status: DC

## 2015-05-01 NOTE — Telephone Encounter (Signed)
Spoke with patient. Advised of message as seen below from [place]. Patient is agreeable. Appointment scheduled for today at 1:15 pm with Dr.Silva. Patient will leave work now.  Routing to provider for final review. Patient agreeable to disposition. Will close encounter.

## 2015-05-01 NOTE — Telephone Encounter (Addendum)
Spoke with patient. Patient states that this morning she fell trying to get to her car. Patient had a transvaginal tape procedure with cystoscopy on 04/04/2015 with Dr.Silva. Patient states that she is now experiencing increased pelvic pain that is a 7/10. Feels she is having increased swelling to her surgical area. Denies any bleeding. Advised she will need to be seen in the office for further evaluation. Offered appointment today at 1:15 pm with Dr.Silva. Patient will need to speak with her employer and return call.

## 2015-05-01 NOTE — Telephone Encounter (Signed)
I recommend evaluation.

## 2015-05-01 NOTE — Progress Notes (Signed)
Patient ID: Deanna Caldwell, female   DOB: [birth year], 55 y.o.   MRN: [id number] GYNECOLOGY  VISIT   HPI: 55 y.o.   Married  [place] American  female   [phone] with Patient's last menstrual period was 04/23/2003 (approximate).   here because patient fell on the ice today and landed on her backside and now having some abdominal discomfort.  Patient is almost 4 weeks post TVT and cystoscopy procedure. TVT done on 04/04/15.   Fell on her tail done.  Now feels swelling along pubic bone.  No bleeding.  No leakage of urine.  No pain med use.   GYNECOLOGIC HISTORY: Patient's last menstrual period was 04/23/2003 (approximate). Contraception:Hysterectomy Menopausal hormone therapy: Estrace 56m daily Last [facility]: [facility] Last pap smear: 08/2014         OB History    Gravida Para Term Preterm AB TAB SAB Ectopic Multiple Living   _0 Patient Active Problem List   Diagnosis Date Noted  . Primary osteoarthritis of both knees 08/19/2014  . Major depressive disorder, recurrent episode, severe, without mention of psychotic behavior 12/14/2012  . Anxiety disorder 12/14/2012  . Major depressive disorder ([place] 11/12/2011    Past Medical History  Diagnosis Date  . TIA (transient ischemic attack) 10/04/2011  . Hypertension 2007  . Sleep apnea 2014  . UTI (urinary tract infection)   . Endometriosis   . Fibroid   . Anxiety   . Depression   . Diabetes mellitus, type II ([place]     12/31/2011--diet controlled  . Urinary incontinence   . BRCA negative     Past Surgical History  Procedure Laterality Date  . Foot surgery  2010    Removed cartilage from both feet  . Oophorectomy  2007    BSO--due to cysts  . Abdominal hysterectomy  2005    fibroids/endometriosis  . Bladder suspension N/A 04/04/2015    Procedure: TRANSVAGINAL TAPE (TVT) PROCEDURE;  Surgeon: BNunzio Cobbs MD;  [facility];  Service: Gynecology;   Laterality: N/A;  1.25 hours  . Cystoscopy N/A 04/04/2015    Procedure: CYSTOSCOPY;  Surgeon: BNunzio Cobbs MD;  [facility];  Service: Gynecology;  Laterality: N/A;    Current Outpatient Prescriptions  Medication Sig Dispense Refill  . atorvastatin (LIPITOR) 20 MG tablet Take 20 mg by mouth at bedtime.    . celecoxib (CELEBREX) 200 MG capsule One to 2 tablets by mouth daily as needed for pain. (Patient taking differently: Take 200-400 mg by mouth daily as needed for moderate pain. One to 2 tablets by mouth daily as needed for pain.) 90 capsule 4  . DULoxetine (CYMBALTA) 30 MG capsule Take 1 capsule by mouth daily.    .Marland Kitchenestradiol (ESTRACE) 1 MG tablet Take 1 mg by mouth daily.    . metFORMIN (GLUCOPHAGE) 500 MG tablet Take 500 mg by mouth daily.   3  . valsartan-hydrochlorothiazide (DIOVAN-HCT) 320-25 MG per tablet Take 1 tablet by mouth daily.    .Marland Kitchenzolpidem (AMBIEN) 10 MG tablet Take 10 mg by mouth at bedtime as needed for sleep.     . metroNIDAZOLE (FLAGYL) 500 MG tablet Reported on 05/01/2015     No current facility-administered medications for this visit.     ALLERGIES: Review of patient's allergies indicates no known allergies.  Family History  Problem Relation  Age of Onset  . Heart attack Mother   . Hypertension Mother   . Cancer Mother   . Stroke Mother   . Breast cancer Mother     dec age 24  . Stroke Father   . Stroke Brother   . Diabetes Brother   . Hypertension Brother   . Diabetes type II Sister 39  . Breast cancer Sister   . Hypertension Sister   . Diabetes type II Paternal Aunt   . Diabetes type II Paternal Uncle   . Miscarriages / [place] Daughter   . Stroke Brother   . Hypertension Brother   . Breast cancer Sister 55  . Diabetes Sister   . Breast cancer Sister 75    metastatic  . Hypertension Sister     Social History   Social History  . Marital Status: Married    Spouse Name: N/A  . Number of Children: N/A  . Years of  Education: N/A   Occupational History  . Not on file.   Social History Main Topics  . Smoking status: Never Smoker   . Smokeless tobacco: Never Used  . Alcohol Use: 0.0 oz/week    0 Standard drinks or equivalent per week     Comment: occasionally-1 drink every 2 months  . Drug Use: No  . Sexual Activity:    Partners: Male    Birth Control/ Protection: Surgical     Comment: TAH 2005/BSO 2007   Other Topics Concern  . Not on file   Social History Narrative    ROS:  Pertinent items are noted in HPI.  PHYSICAL EXAMINATION:    BP 130/72 mmHg  Pulse 80  Ht _0  (1.676 m)  Wt 223 lb (101.152 kg)  BMI 36.01 kg/m2  LMP 04/23/2003 (Approximate)    General appearance: alert, cooperative and appears stated age     Pelvic: External genitalia:  no lesions.  Suprapubic incision knots clipped and removed.               Urethra:  normal appearing urethra with no masses, tenderness or lesions              Bartholins and Skenes: normal                 Vagina: normal appearing vagina with normal color and discharge, no lesions.  Small 5 mm separation of vaginal mucosa.  No visible sling but palpably, it feels like sling is now exposed.              Cervix:  Absent.             Bimanual Exam:  Uterus:  uterus absent              Adnexa: normal adnexa and no mass, fullness, tenderness         Buttocks - no hematoma.   Chaperone was present for exam.  ASSESSMENT  Status post fall.  Small amount of midurethral sling exposure.   PLAN  Counseled regarding findings.  Start Estrace 1/2 gm pv at hs for 2 weeks and then twice weekly.  Instructed in use and risks and benefits discussed.  I explained that this will help the mucosa to re-epithelize over the exposed sling area. Ok to work as the patient does not do lifting or strenuous activity.  No exercise.  Follow up in one week and then one week later also.  Call for bleeding, increased pain, or any other concern.  An After Visit  Summary was printed and given to the patient.  __15____ minutes face to face time of which over 50% was spent in counseling.

## 2015-05-01 NOTE — Telephone Encounter (Signed)
Patient had surgery on 04/04/15 and is having pain and discomfort. She would like to discuss this with the nurse.

## 2015-05-01 NOTE — Telephone Encounter (Signed)
Patient is calling to check on status of speaking with Dr.Silva. Advised I have not been able to speak with Dr.Silva yet, but I have sent her a message and will call her back as soon as I speak with her. Patient is agreeable.

## 2015-05-01 NOTE — Telephone Encounter (Signed)
Spoke with patient. Patient states she is able to make the appointment today at 1:15 pm with Dr.Silva but prefers I speak with Dr.Silva before scheduling. "Could I just take pain medicine? I just want to make sure I need to be seen." Patient's next follow up appointment is scheduled for 05/15/2015. Advised I will speak with Dr.Silva and return call. Patient is agreeable.

## 2015-05-02 ENCOUNTER — Telehealth: Payer: Self-pay | Admitting: Obstetrics and Gynecology

## 2015-05-02 NOTE — Telephone Encounter (Signed)
OK for time off until 05/08/15 due to fall.  Patient had minor incision separation of the anterior vaginal wall.  Unclear if this happened as a result of the fall or if it happened prior to that.

## 2015-05-02 NOTE — Telephone Encounter (Signed)
Spoke with patient. Patient states that she was seen yesterday in office for follow up post op after falling. Patient is asking if she should be at work. "I am very sore. I know she found some things that were causing me discomfort and the fall has made me sore." Advised per OV it is okay for her to return to work as long as she is not performing any heavy lifting or strenuous activity. 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 "I know the files are not heavier than 10 pounds, but I want to make sure I have time to heal." She spoke with her insurance company who states that she may have an extension for time off if needed. Patient is asking if Dr.Silva feels she could have time off until Monday 1/16 when she is seen for follow up. Patient is requesting paper work be sent to her insurance if Dr.Silva is agreeable. Patient would also like to be notified as she is at work currently and is uncomfortable. Advised I will speak with Dr.Silva and see what needs to be done. Patient is agreeable.

## 2015-05-02 NOTE — Telephone Encounter (Signed)
Patient would like to speak with nurse regarding an issue she was seen for on yesterday.

## 2015-05-02 NOTE — Telephone Encounter (Signed)
Spoke with patient. Advised of message as seen below from [place]. Advised I will notify Janett Billow so that paper work can be completed. Patient is agreeable. She will be going home from work at this time. Patient requests to be notified once the paper work has been completed with her insurance.

## 2015-05-03 ENCOUNTER — Encounter: Payer: Self-pay | Admitting: Obstetrics and Gynecology

## 2015-05-08 ENCOUNTER — Encounter: Payer: Self-pay | Admitting: Obstetrics and Gynecology

## 2015-05-08 ENCOUNTER — Ambulatory Visit (INDEPENDENT_AMBULATORY_CARE_PROVIDER_SITE_OTHER): Payer: BLUE CROSS/BLUE SHIELD | Admitting: Obstetrics and Gynecology

## 2015-05-08 VITALS — BP 120/74 | HR 76 | Ht 66.0 in | Wt 218.4 lb

## 2015-05-08 DIAGNOSIS — Z9189 Other specified personal risk factors, not elsewhere classified: Secondary | ICD-10-CM

## 2015-05-08 DIAGNOSIS — T83418D Breakdown (mechanical) of other prosthetic devices, implants and grafts of genital tract, subsequent encounter: Secondary | ICD-10-CM

## 2015-05-08 DIAGNOSIS — T83712D Erosion of implanted urethral mesh to surrounding organ or tissue, subsequent encounter: Secondary | ICD-10-CM

## 2015-05-08 DIAGNOSIS — Z9181 History of falling: Secondary | ICD-10-CM

## 2015-05-08 NOTE — Progress Notes (Signed)
Patient ID: Deanna Caldwell, female   DOB: Feb 05, 1961, 55 y.o.   MRN: [id number] GYNECOLOGY  VISIT   HPI: 55 y.o.   Married  [place] American  female   [phone] with Patient's last menstrual period was 04/23/2003 (approximate).   here for 1 week follow up.  Patient fell on ice and came in for office visit.  Had pain in her lower back.  Was noted to have a small vaginal wall incision separation over the sling.  Treated with vaginal estrogen cream.   Feeling much better.  Had some bruising on her back. Using vaginal cream vaginally.  Left suprapubic tenderness as she fell on that side.  No bleeding. No leakage and voiding well.   Note for work said she was supposed to return to work today.  Patient understood that she was to return to work tomorrow.  States her work did not receive notes from our last office visit.   Asking about her chart. Went on My Chart and did not like that there were diagnoses about depression.   GYNECOLOGIC HISTORY: Patient's last menstrual period was 04/23/2003 (approximate). Contraception: Hysterectomy Menopausal hormone therapy: Estrace 54m, Estrace 0.171mcream 1/2 gm nightly Last [facility]: 08[facility]2014 [facility]: [facility] normal per patient        OB History    Gravida Para Term Preterm AB TAB SAB Ectopic Multiple Living   4 3 3  1  1   3          Patient Active Problem List   Diagnosis Date Noted  . Primary osteoarthritis of both knees 08/19/2014  . Major depressive disorder, recurrent episode, severe, without mention of psychotic behavior 12/14/2012  . Anxiety disorder 12/14/2012  . Major depressive disorder ([place]/23[place]    Past Medical History  Diagnosis Date  . TIA (transient ischemic attack) 10/04/2011  . Hypertension 2007  . Sleep apnea 2014  . UTI (urinary tract infection)   . Endometriosis   . Fibroid   . Anxiety   . Depression   . Diabetes mellitus, type II  ([facility]    12/31/2011--diet controlled  . Urinary incontinence   . BRCA negative     Past Surgical History  Procedure Laterality Date  . Foot surgery  2010    Removed cartilage from both feet  . Oophorectomy  2007    BSO--due to cysts  . Abdominal hysterectomy  2005    fibroids/endometriosis  . Bladder suspension N/A 04/04/2015    Procedure: TRANSVAGINAL TAPE (TVT) PROCEDURE;  Surgeon: BrNunzio CobbsMD;  [facility];  Service: Gynecology;  Laterality: N/A;  1.25 hours  . Cystoscopy N/A 04/04/2015    Procedure: CYSTOSCOPY;  Surgeon: BrNunzio CobbsMD;  [facility];  Service: Gynecology;  Laterality: N/A;    Current Outpatient Prescriptions  Medication Sig Dispense Refill  . atorvastatin (LIPITOR) 20 MG tablet Take 20 mg by mouth at bedtime.    . celecoxib (CELEBREX) 200 MG capsule One to 2 tablets by mouth daily as needed for pain. (Patient taking differently: Take 200-400 mg by mouth daily as needed for moderate pain. One to 2 tablets by mouth daily as needed for pain.) 90 capsule 4  . DULoxetine (CYMBALTA) 30 MG capsule Take 1 capsule by mouth daily.    . Marland Kitchenstradiol (ESTRACE) 0.1 MG/GM vaginal cream Use 1/2 g vaginally every night for the first 2 weeks, then use 1/2  g vaginally at night twice a week. 42.5 g 1  . estradiol (ESTRACE) 1 MG tablet Take 1 mg by mouth daily.    . metFORMIN (GLUCOPHAGE) 500 MG tablet Take 500 mg by mouth daily.   3  . metroNIDAZOLE (FLAGYL) 500 MG tablet Reported on 05/01/2015    . valsartan-hydrochlorothiazide (DIOVAN-HCT) 320-25 MG per tablet Take 1 tablet by mouth daily.    Marland Kitchen zolpidem (AMBIEN) 10 MG tablet Take 10 mg by mouth at bedtime as needed for sleep.      No current facility-administered medications for this visit.     ALLERGIES: Review of patient's allergies indicates no known allergies.  Family History  Problem Relation Age of Onset  . Heart attack Mother   . Hypertension Mother   . Cancer Mother   . Stroke  Mother   . Breast cancer Mother     dec age 74  . Stroke Father   . Stroke Brother   . Diabetes Brother   . Hypertension Brother   . Diabetes type II Sister 53  . Breast cancer Sister   . Hypertension Sister   . Diabetes type II Paternal Aunt   . Diabetes type II Paternal Uncle   . Miscarriages / [place] Daughter   . Stroke Brother   . Hypertension Brother   . Breast cancer Sister 47  . Diabetes Sister   . Breast cancer Sister 52    metastatic  . Hypertension Sister     Social History   Social History  . Marital Status: Married    Spouse Name: N/A  . Number of Children: N/A  . Years of Education: N/A   Occupational History  . Not on file.   Social History Main Topics  . Smoking status: Never Smoker   . Smokeless tobacco: Never Used  . Alcohol Use: 0.0 oz/week    0 Standard drinks or equivalent per week     Comment: occasionally-1 drink every 2 months  . Drug Use: No  . Sexual Activity:    Partners: Male    Birth Control/ Protection: Surgical     Comment: TAH 2005/BSO 2007   Other Topics Concern  . Not on file   Social History Narrative    ROS:  Pertinent items are noted in HPI.  PHYSICAL EXAMINATION:    BP 120/74 mmHg  Pulse 76  Ht 5' 6"  (1.676 m)  Wt 218 lb 6.4 oz (99.066 kg)  BMI 35.27 kg/m2  LMP 04/23/2003 (Approximate)    General appearance: alert, cooperative and appears stated age  Pelvic: External genitalia:  no lesions              Urethra:  normal appearing urethra with no masses, tenderness or lesions              Bartholins and Skenes: normal                 Vagina:  5 mm anterior vaginal mucosal separation and sling visible and palpable.  No bleeding.  No inflammation.               Cervix: absent         Bimanual Exam:  Uterus:  uterus absent              Adnexa: no mass, fullness, tenderness           Chaperone was present for exam.  ASSESSMENT  Status post fall.  Vaginal sling erosion into vagina following anterior  vaginal mucosal separation.   PLAN  Counseled regarding vaginal sling exposure.  Continue vaginal Estrace cream pv nightly for one more week and then twice weekly.  I discussed my expectation that the area would resolved with time and vaginal estrogen treatment.  On occasion, a few fibers of the sling need to be clipped if there is not complete coverage.  I do not recommend surgical care for revision of the exposure at this time.  OK to return to work.  Letter written for patient.  No lifting, carrying, etc. Over 10 pounds.  No sexual activity.  Will have follow up in one week.    An After Visit Summary was printed and given to the patient.  __15____ minutes face to face time of which over 50% was spent in counseling.

## 2015-05-08 NOTE — Progress Notes (Deleted)
Patient ID: Deanna Caldwell, female   DOB: 01-Oct-1960, 55 y.o.   MRN: [id number] 55 y.o. [id number] Married [place] American female here for annual exam.    PCP:     Patient's last menstrual period was 04/23/2003 (approximate).          Sexually active: {yes no:314532}  The current method of family planning is {contraception:315051}.    Exercising: {yes no:314532}  {types:19826} Smoker:  {YES NO:22349}  Health Maintenance: Pap:  *** History of abnormal Pap:  {YES NO:22349} MMG:  *** Colonoscopy:  *** BMD:   ***  Result  *** TDaP:  *** Screening Labs:  Hb today: ***, Urine today: ***   reports that she has never smoked. She has never used smokeless tobacco. She reports that she drinks alcohol. She reports that she does not use illicit drugs.  Past Medical History  Diagnosis Date  . TIA (transient ischemic attack) 10/04/2011  . Hypertension 2007  . Sleep apnea 2014  . UTI (urinary tract infection)   . Endometriosis   . Fibroid   . Anxiety   . Depression   . Diabetes mellitus, type II ([place])     12/31/2011--diet controlled  . Urinary incontinence   . BRCA negative     Past Surgical History  Procedure Laterality Date  . Foot surgery  2010    Removed cartilage from both feet  . Oophorectomy  2007    BSO--due to cysts  . Abdominal hysterectomy  2005    fibroids/endometriosis  . Bladder suspension N/A 04/04/2015    Procedure: TRANSVAGINAL TAPE (TVT) PROCEDURE;  Surgeon: Nunzio Cobbs, MD;  Location: [facility];  Service: Gynecology;  Laterality: N/A;  1.25 hours  . Cystoscopy N/A 04/04/2015    Procedure: CYSTOSCOPY;  Surgeon: Nunzio Cobbs, MD;  Location: [facility];  Service: Gynecology;  Laterality: N/A;    Current Outpatient Prescriptions  Medication Sig Dispense Refill  . atorvastatin (LIPITOR) 20 MG tablet Take 20 mg by mouth at bedtime.    . celecoxib (CELEBREX) 200 MG capsule One to 2 tablets by mouth daily as needed for pain. (Patient taking differently:  Take 200-400 mg by mouth daily as needed for moderate pain. One to 2 tablets by mouth daily as needed for pain.) 90 capsule 4  . DULoxetine (CYMBALTA) 30 MG capsule Take 1 capsule by mouth daily.    Marland Kitchen estradiol (ESTRACE) 0.1 MG/GM vaginal cream Use 1/2 g vaginally every night for the first 2 weeks, then use 1/2 g vaginally at night twice a week. 42.5 g 1  . estradiol (ESTRACE) 1 MG tablet Take 1 mg by mouth daily.    . metFORMIN (GLUCOPHAGE) 500 MG tablet Take 500 mg by mouth daily.   3  . metroNIDAZOLE (FLAGYL) 500 MG tablet Reported on 05/01/2015    . valsartan-hydrochlorothiazide (DIOVAN-HCT) 320-25 MG per tablet Take 1 tablet by mouth daily.    Marland Kitchen zolpidem (AMBIEN) 10 MG tablet Take 10 mg by mouth at bedtime as needed for sleep.      No current facility-administered medications for this visit.    Family History  Problem Relation Age of Onset  . Heart attack Mother   . Hypertension Mother   . Cancer Mother   . Stroke Mother   . Breast cancer Mother     dec age 15  . Stroke Father   . Stroke Brother   . Diabetes Brother   . Hypertension Brother   . Diabetes type II  Sister 28  . Breast cancer Sister   . Hypertension Sister   . Diabetes type II Paternal Aunt   . Diabetes type II Paternal Uncle   . Miscarriages / [place] Daughter   . Stroke Brother   . Hypertension Brother   . Breast cancer Sister 80  . Diabetes Sister   . Breast cancer Sister 62    metastatic  . Hypertension Sister     ROS:  Pertinent items are noted in HPI.  Otherwise, a comprehensive ROS was negative.  Exam:   LMP 04/23/2003 (Approximate)    General appearance: alert, cooperative and appears stated age Head: Normocephalic, without obvious abnormality, atraumatic Neck: no adenopathy, supple, symmetrical, trachea midline and thyroid {EXAM; THYROID:18604} Lungs: clear to auscultation bilaterally Breasts: {Exam; breast:13139::"normal appearance, no masses or tenderness"} Heart: regular rate and  rhythm Abdomen: soft, non-tender; bowel sounds normal; no masses,  no organomegaly Extremities: extremities normal, atraumatic, no cyanosis or edema Skin: Skin color, texture, turgor normal. No rashes or lesions Lymph nodes: Cervical, supraclavicular, and axillary nodes normal. No abnormal inguinal nodes palpated Neurologic: Grossly normal  Pelvic: External genitalia:  no lesions              Urethra:  normal appearing urethra with no masses, tenderness or lesions              Bartholins and Skenes: normal                 Vagina: normal appearing vagina with normal color and discharge, no lesions              Cervix: {exam; cervix:14595}              Pap taken: {yes no:314532} Bimanual Exam:  Uterus:  {exam; uterus:12215}              Adnexa: {exam; adnexa:12223}              Rectovaginal: {yes no:314532}.  Confirms.              Anus:  normal sphincter tone, no lesions  Chaperone was present for exam.  Assessment:   Well woman visit with normal exam.   Plan: Yearly mammogram recommended after age 78.  Recommended self breast exam.  Pap and HR HPV as above. Discussed Calcium, Vitamin D, regular exercise program including cardiovascular and weight bearing exercise. Labs performed.  {yes [id number].   See orders. Refills given on medications.  {yes [id number].  See orders. Follow up annually and prn.   Additional counseling given __________________.  After visit summary provided.

## 2015-05-08 NOTE — Patient Instructions (Signed)
I do not recommend lifting or carrying over 10 pounds.  I do not recommend sexual activity until the vagina heals completely.

## 2015-05-12 ENCOUNTER — Telehealth: Payer: Self-pay | Admitting: *Deleted

## 2015-05-12 NOTE — Telephone Encounter (Signed)
Return call to [facility] regarding patient's STD claim. Request confirmation if recent complication of vaginal incision was related to recent fall or if happened prior. Advised that per Dr Quincy Simmonds, it is unclear when small erosion occurred but regardless, it is still related to the surgical procedure. Anderson Malta will update information and call back if further questions.  Routing to provider for final review.  Will close encounter.

## 2015-05-15 ENCOUNTER — Encounter: Payer: Self-pay | Admitting: Obstetrics and Gynecology

## 2015-05-15 ENCOUNTER — Ambulatory Visit (INDEPENDENT_AMBULATORY_CARE_PROVIDER_SITE_OTHER): Payer: BLUE CROSS/BLUE SHIELD | Admitting: Obstetrics and Gynecology

## 2015-05-15 VITALS — BP 130/78 | HR 76 | Ht 66.0 in | Wt 222.8 lb

## 2015-05-15 DIAGNOSIS — T83721S Exposure of implanted vaginal mesh and other prosthetic materials into vagina, sequela: Secondary | ICD-10-CM

## 2015-05-15 NOTE — Progress Notes (Signed)
Patient ID: Deanna Caldwell, female   DOB: 1960/06/13, 55 y.o.   MRN: [id number] GYNECOLOGY  VISIT   HPI: 55 y.o.   Married  [place] American  female   [phone] with Patient's last menstrual period was 04/23/2003 (approximate).   here for 6 week follow up TRANSVAGINAL TAPE (TVT) PROCEDURE (N/A )  CYSTOSCOPY (N/A ).   Using the vaginal Estrace nightly for two weeks.  Will now reduce to 2 times per week.  No bleeding.  Occasionally has suprapubic tenderness.   Good control of urine.     GYNECOLOGIC HISTORY: Patient's last menstrual period was 04/23/2003 (approximate). Contraception:Hysterectomy Menopausal hormone therapy: Estrace 60m, Estrace 0.123mCream 1/2 gm nightly Last mammogram: 08/2014 normal [facility] pt[facility] Last pap smear: 08/2014 normal per patient.        OB History    Gravida Para Term Preterm AB TAB SAB Ectopic Multiple Living   4 3 3  1  1   3          Patient Active Problem List   Diagnosis Date Noted  . Primary osteoarthritis of both knees 08/19/2014  . Major depressive disorder, recurrent episode, severe, without mention of psychotic behavior 12/14/2012  . Anxiety disorder 12/14/2012  . Major depressive disorder ([place]07/23/2013    Past Medical History  Diagnosis Date  . TIA (transient ischemic attack) 10/04/2011  . Hypertension 2007  . Sleep apnea 2014  . UTI (urinary tract infection)   . Endometriosis   . Fibroid   . Anxiety   . Depression   . Diabetes mellitus, type II ([place]    12/31/2011--diet controlled  . Urinary incontinence   . BRCA negative     Past Surgical History  Procedure Laterality Date  . Foot surgery  2010    Removed cartilage from both feet  . Oophorectomy  2007    BSO--due to cysts  . Abdominal hysterectomy  2005    fibroids/endometriosis  . Bladder suspension N/A 04/04/2015    Procedure: TRANSVAGINAL TAPE (TVT) PROCEDURE;  Surgeon: BrNunzio CobbsMD;  [facility];  Service: Gynecology;  Laterality: N/A;  1.25 hours  . Cystoscopy N/A 04/04/2015    Procedure: CYSTOSCOPY;  Surgeon: BrNunzio CobbsMD;  [facility];  Service: Gynecology;  Laterality: N/A;    Current Outpatient Prescriptions  Medication Sig Dispense Refill  . atorvastatin (LIPITOR) 20 MG tablet Take 20 mg by mouth at bedtime.    . celecoxib (CELEBREX) 200 MG capsule One to 2 tablets by mouth daily as needed for pain. (Patient taking differently: Take 200-400 mg by mouth daily as needed for moderate pain. One to 2 tablets by mouth daily as needed for pain.) 90 capsule 4  . DULoxetine (CYMBALTA) 30 MG capsule Take 1 capsule by mouth daily.    . Marland Kitchenstradiol (ESTRACE) 0.1 MG/GM vaginal cream Use 1/2 g vaginally every night for the first 2 weeks, then use 1/2 g vaginally at night twice a week. 42.5 g 1  . estradiol (ESTRACE) 1 MG tablet Take 1 mg by mouth daily.    . metFORMIN (GLUCOPHAGE) 500 MG tablet Take 500 mg by mouth daily.   3  . valsartan-hydrochlorothiazide (DIOVAN-HCT) 320-25 MG per tablet Take 1 tablet by mouth daily.    . Marland Kitchenolpidem (AMBIEN) 10 MG tablet Take 10 mg by mouth at bedtime as needed for sleep.     . metroNIDAZOLE (FLAGYL) 500 MG tablet Reported  on 05/15/2015     No current facility-administered medications for this visit.     ALLERGIES: Review of patient's allergies indicates no known allergies.  Family History  Problem Relation Age of Onset  . Heart attack Mother   . Hypertension Mother   . Cancer Mother   . Stroke Mother   . Breast cancer Mother     dec age 49  . Stroke Father   . Stroke Brother   . Diabetes Brother   . Hypertension Brother   . Diabetes type II Sister 38  . Breast cancer Sister   . Hypertension Sister   . Diabetes type II Paternal Aunt   . Diabetes type II Paternal Uncle   . Miscarriages / [place] Daughter   . Stroke Brother   . Hypertension Brother   . Breast cancer Sister 74  . Diabetes Sister   . Breast cancer  Sister 49    metastatic  . Hypertension Sister     Social History   Social History  . Marital Status: Married    Spouse Name: N/A  . Number of Children: N/A  . Years of Education: N/A   Occupational History  . Not on file.   Social History Main Topics  . Smoking status: Never Smoker   . Smokeless tobacco: Never Used  . Alcohol Use: 0.0 oz/week    0 Standard drinks or equivalent per week     Comment: occasionally-1 drink every 2 months  . Drug Use: No  . Sexual Activity:    Partners: Male    Birth Control/ Protection: Surgical     Comment: TAH 2005/BSO 2007   Other Topics Concern  . Not on file   Social History Narrative    ROS:  Pertinent items are noted in HPI.  PHYSICAL EXAMINATION:    BP 130/78 mmHg  Pulse 76  Ht 5' 6"  (1.676 m)  Wt 222 lb 12.8 oz (101.061 kg)  BMI 35.98 kg/m2  LMP 04/23/2003 (Approximate)    General appearance: alert, cooperative and appears stated age  Pelvic: External genitalia:  no lesions.  SP incisions intact.  No masses.  Nontender.              Urethra:  normal appearing urethra with no masses, tenderness or lesions              Bartholins and Skenes: normal                 Vagina: normal appearing vagina with normal color and discharge,  3 mm area of separation of the vaginal mucosa.               Cervix: absent              Uterus:  Absent.               Adnexa:  No masses, nontender.  Chaperone was present for exam.  ASSESSMENT  Exposure of mesh through vaginal wall.  Healing well with local vaginal estrogen cream and time.  Status post fall.   PLAN  Counseled regarding vaginal separation.  Continue vaginal Estrace 1/2 gm pv at hs twice weekly.  Follow up in one week.  Pelvic rest.    An After Visit Summary was printed and given to the patient.  __15____ minutes face to face time of which over 50% was spent in counseling.

## 2015-05-22 ENCOUNTER — Ambulatory Visit (INDEPENDENT_AMBULATORY_CARE_PROVIDER_SITE_OTHER): Payer: BLUE CROSS/BLUE SHIELD | Admitting: Obstetrics and Gynecology

## 2015-05-22 ENCOUNTER — Encounter: Payer: Self-pay | Admitting: Obstetrics and Gynecology

## 2015-05-22 VITALS — BP 114/72 | HR 78 | Resp 18 | Ht 66.0 in | Wt 221.0 lb

## 2015-05-22 DIAGNOSIS — T83721D Exposure of implanted vaginal mesh and other prosthetic materials into vagina, subsequent encounter: Secondary | ICD-10-CM

## 2015-05-22 NOTE — Progress Notes (Signed)
Patient ID: Deanna Caldwell, female   DOB: Jul 11, 1960, 55 y.o.   MRN: [id number] GYNECOLOGY  VISIT   HPI: 55 y.o.   Married  [place] American  female   [phone] with Patient's last menstrual period was 04/23/2003 (approximate).   here for 7 week follow up  TRANSVAGINAL TAPE (TVT) PROCEDURE (N/A )  CYSTOSCOPY (N/A ).   Patient being treated and followed for mesh exposure following a fall.  Using vaginal estrogen cream twice a week.   No pain, bleeding, or urinary problems.  Not leaking urine.   GYNECOLOGIC HISTORY: Patient's last menstrual period was 04/23/2003 (approximate). Contraception:Hysterectomy Menopausal hormone therapy:Estrace Cream and Tablet Last mammogram: 08/2014 normal per pt.(through [facility]) Last pap smear: 08/2014 normal per patient        OB History    Gravida Para Term Preterm AB TAB SAB Ectopic Multiple Living   4 3 3  1  1   3          Patient Active Problem List   Diagnosis Date Noted  . Primary osteoarthritis of both knees 08/19/2014  . Major depressive disorder, recurrent episode, severe, without mention of psychotic behavior 12/14/2012  . Anxiety disorder 12/14/2012  . Major depressive disorder ([place]) 11/12/2011    Past Medical History  Diagnosis Date  . TIA (transient ischemic attack) 10/04/2011  . Hypertension 2007  . Sleep apnea 2014  . UTI (urinary tract infection)   . Endometriosis   . Fibroid   . Anxiety   . Depression   . Diabetes mellitus, type II ([place])     12/31/2011--diet controlled  . Urinary incontinence   . BRCA negative     Past Surgical History  Procedure Laterality Date  . Foot surgery  2010    Removed cartilage from both feet  . Oophorectomy  2007    BSO--due to cysts  . Abdominal hysterectomy  2005    fibroids/endometriosis  . Bladder suspension N/A 04/04/2015    Procedure: TRANSVAGINAL TAPE (TVT) PROCEDURE;  Surgeon: Nunzio Cobbs, MD;  Location: [facility];  Service:  Gynecology;  Laterality: N/A;  1.25 hours  . Cystoscopy N/A 04/04/2015    Procedure: CYSTOSCOPY;  Surgeon: Nunzio Cobbs, MD;  Location: [place] [facility];  Service: Gynecology;  Laterality: N/A;    Current Outpatient Prescriptions  Medication Sig Dispense Refill  . atorvastatin (LIPITOR) 20 MG tablet Take 20 mg by mouth at bedtime.    . celecoxib (CELEBREX) 200 MG capsule One to 2 tablets by mouth daily as needed for pain. (Patient taking differently: Take 200-400 mg by mouth daily as needed for moderate pain. One to 2 tablets by mouth daily as needed for pain.) 90 capsule 4  . DULoxetine (CYMBALTA) 30 MG capsule Take 1 capsule by mouth daily.    Marland Kitchen estradiol (ESTRACE) 0.1 MG/GM vaginal cream Use 1/2 g vaginally every night for the first 2 weeks, then use 1/2 g vaginally at night twice a week. 42.5 g 1  . estradiol (ESTRACE) 1 MG tablet Take 1 mg by mouth daily.    . metFORMIN (GLUCOPHAGE) 500 MG tablet Take 500 mg by mouth daily.   3  . metroNIDAZOLE (FLAGYL) 500 MG tablet Reported on 05/15/2015    . valsartan-hydrochlorothiazide (DIOVAN-HCT) 320-25 MG per tablet Take 1 tablet by mouth daily.    Marland Kitchen zolpidem (AMBIEN) 10 MG tablet Take 10 mg by mouth at bedtime as needed for sleep.  No current facility-administered medications for this visit.     ALLERGIES: Review of patient's allergies indicates no known allergies.  Family History  Problem Relation Age of Onset  . Heart attack Mother   . Hypertension Mother   . Cancer Mother   . Stroke Mother   . Breast cancer Mother     dec age 69  . Stroke Father   . Stroke Brother   . Diabetes Brother   . Hypertension Brother   . Diabetes type II Sister 23  . Breast cancer Sister   . Hypertension Sister   . Diabetes type II Paternal Aunt   . Diabetes type II Paternal Uncle   . Miscarriages / [place] Daughter   . Stroke Brother   . Hypertension Brother   . Breast cancer Sister 61  . Diabetes Sister   . Breast cancer Sister 36     metastatic  . Hypertension Sister     Social History   Social History  . Marital Status: Married    Spouse Name: N/A  . Number of Children: N/A  . Years of Education: N/A   Occupational History  . Not on file.   Social History Main Topics  . Smoking status: Never Smoker   . Smokeless tobacco: Never Used  . Alcohol Use: 0.0 oz/week    0 Standard drinks or equivalent per week     Comment: occasionally-1 drink every 2 months  . Drug Use: No  . Sexual Activity:    Partners: Male    Birth Control/ Protection: Surgical     Comment: TAH 2005/BSO 2007   Other Topics Concern  . Not on file   Social History Narrative    ROS:  Pertinent items are noted in HPI.  PHYSICAL EXAMINATION:    Ht 5' 6"  (1.676 m)  LMP 04/23/2003 (Approximate)    General appearance: alert, cooperative and appears stated age   Pelvic: External genitalia:  no lesions              Urethra:  normal appearing urethra with no masses, tenderness or lesions              Bartholins and Skenes: normal                 Vagina: 7  X 7 mm exposed midurethral sling along anterior vaginal wall.  No erythema of skin edges.  No granulation tissue.  Vaginal estrogen cream removed with Q-tips.              Cervix: absent              Bimanual Exam:  Uterus:  uterus absent              Adnexa: no mass, fullness, tenderness             Chaperone was present for exam.  ASSESSMENT  Midurethral sling exposure status post fall.   PLAN  Counseled regarding vaginal mesh exposure.  I will have patient increase the use of the vaginal estrogen to 1/2 gram pv at hs for the next week.  If mesh does have re-epithelialization, will need to either clip the fibers or do surgical re-closure of the vaginal mucosa. No lifting over 10 pounds.  I recommend continued pelvic rest. Follow up in one week.   An After Visit Summary was printed and given to the patient.  __25____ minutes face to face time of which over 50% was spent in  counseling.

## 2015-05-31 ENCOUNTER — Ambulatory Visit: Payer: BLUE CROSS/BLUE SHIELD | Admitting: Obstetrics and Gynecology

## 2015-06-02 ENCOUNTER — Ambulatory Visit (INDEPENDENT_AMBULATORY_CARE_PROVIDER_SITE_OTHER): Payer: BLUE CROSS/BLUE SHIELD | Admitting: Obstetrics and Gynecology

## 2015-06-02 ENCOUNTER — Encounter: Payer: Self-pay | Admitting: Obstetrics and Gynecology

## 2015-06-02 VITALS — Ht 66.0 in

## 2015-06-02 DIAGNOSIS — T83721D Exposure of implanted vaginal mesh and other prosthetic materials into vagina, subsequent encounter: Secondary | ICD-10-CM

## 2015-06-02 NOTE — Progress Notes (Signed)
Patient ID: Deanna Caldwell, female   DOB: 1960-06-28, 55 y.o.   MRN: [id number] GYNECOLOGY  VISIT   HPI: 55 y.o.   Married  [place] American  female   [phone] with Patient's last menstrual period was 04/23/2003 (approximate).   here for 7 week follow up TRANSVAGINAL TAPE (TVT) PROCEDURE (N/A )  CYSTOSCOPY (N/A ).   Has vaginal mesh exposure treated with vaginal estrogen.  Using vaginal cream nightly. No vaginal bleeding.   Some constipation.   GYNECOLOGIC HISTORY: Patient's last menstrual period was 04/23/2003 (approximate). Contraception:Hysterectomy Menopausal hormone therapy: Estrace cream 1/2gm pv at hs Last mammogram: 08/2014 normal per patient Last pap smear: 08/2014 normal per patient        OB History    Gravida Para Term Preterm AB TAB SAB Ectopic Multiple Living   4 3 3  1  1   3          Patient Active Problem List   Diagnosis Date Noted  . Primary osteoarthritis of both knees 08/19/2014  . Major depressive disorder, recurrent episode, severe, without mention of psychotic behavior 12/14/2012  . Anxiety disorder 12/14/2012  . Major depressive disorder ([place]) 11/12/2011    Past Medical History  Diagnosis Date  . TIA (transient ischemic attack) 10/04/2011  . Hypertension 2007  . Sleep apnea 2014  . UTI (urinary tract infection)   . Endometriosis   . Fibroid   . Anxiety   . Depression   . Diabetes mellitus, type II ([place])     12/31/2011--diet controlled  . Urinary incontinence   . BRCA negative     Past Surgical History  Procedure Laterality Date  . Foot surgery  2010    Removed cartilage from both feet  . Oophorectomy  2007    BSO--due to cysts  . Abdominal hysterectomy  2005    fibroids/endometriosis  . Bladder suspension N/A 04/04/2015    Procedure: TRANSVAGINAL TAPE (TVT) PROCEDURE;  Surgeon: Nunzio Cobbs, MD;  Location: [facility];  Service: Gynecology;  Laterality: N/A;  1.25 hours  . Cystoscopy N/A 04/04/2015    Procedure: CYSTOSCOPY;   Surgeon: Nunzio Cobbs, MD;  Location: [facility];  Service: Gynecology;  Laterality: N/A;    Current Outpatient Prescriptions  Medication Sig Dispense Refill  . atorvastatin (LIPITOR) 20 MG tablet Take 20 mg by mouth at bedtime.    . celecoxib (CELEBREX) 200 MG capsule One to 2 tablets by mouth daily as needed for pain. (Patient taking differently: Take 200-400 mg by mouth daily as needed for moderate pain. One to 2 tablets by mouth daily as needed for pain.) 90 capsule 4  . DULoxetine (CYMBALTA) 30 MG capsule Take 1 capsule by mouth daily.    Marland Kitchen estradiol (ESTRACE) 0.1 MG/GM vaginal cream Use 1/2 g vaginally every night for the first 2 weeks, then use 1/2 g vaginally at night twice a week. 42.5 g 1  . estradiol (ESTRACE) 1 MG tablet Take 1 mg by mouth daily.    . metFORMIN (GLUCOPHAGE) 500 MG tablet Take 500 mg by mouth daily.   3  . metroNIDAZOLE (FLAGYL) 500 MG tablet Reported on 05/15/2015    . valsartan-hydrochlorothiazide (DIOVAN-HCT) 320-25 MG per tablet Take 1 tablet by mouth daily.    Marland Kitchen zolpidem (AMBIEN) 10 MG tablet Take 10 mg by mouth at bedtime as needed for sleep.      No current facility-administered medications for this visit.     ALLERGIES: Review of patient's allergies  indicates no known allergies.  Family History  Problem Relation Age of Onset  . Heart attack Mother   . Hypertension Mother   . Cancer Mother   . Stroke Mother   . Breast cancer Mother     dec age 28  . Stroke Father   . Stroke Brother   . Diabetes Brother   . Hypertension Brother   . Diabetes type II Sister 57  . Breast cancer Sister   . Hypertension Sister   . Diabetes type II Paternal Aunt   . Diabetes type II Paternal Uncle   . Miscarriages / [place] Daughter   . Stroke Brother   . Hypertension Brother   . Breast cancer Sister 88  . Diabetes Sister   . Breast cancer Sister 22    metastatic  . Hypertension Sister     Social History   Social History  . Marital Status:  Married    Spouse Name: N/A  . Number of Children: N/A  . Years of Education: N/A   Occupational History  . Not on file.   Social History Main Topics  . Smoking status: Never Smoker   . Smokeless tobacco: Never Used  . Alcohol Use: 0.0 oz/week    0 Standard drinks or equivalent per week     Comment: occasionally-1 drink every 2 months  . Drug Use: No  . Sexual Activity:    Partners: Male    Birth Control/ Protection: Surgical     Comment: TAH 2005/BSO 2007   Other Topics Concern  . Not on file   Social History Narrative    ROS:  Pertinent items are noted in HPI.  PHYSICAL EXAMINATION:    Ht 5' 6"  (1.676 m)  LMP 04/23/2003 (Approximate)    General appearance: alert, cooperative and appears stated age   Pelvic: External genitalia:  no lesions              Urethra:  normal appearing urethra with no masses, tenderness or lesions              Bartholins and Skenes: normal                 Vagina: normal appearing vagina with normal color and discharge, 7 x 7 mm patch of sling along midurethral visible but with some tissue ingrowth between fibers.  Not as easily palpable now.              Cervix: absent              Bimanual Exam:  Uterus:  uterus absent              Adnexa: no mass, fullness, tenderness                Chaperone was present for exam.  ASSESSMENT  Status post TVT/cystoscopy.  Vaginal mesh exposure following a fall.  Appears to have some tissue ingrowth at this time with vaginal estrogen cream.  PLAN  Counseled regarding options for care continued observation with vaginal estrogen treatment, surgical correction of vaginal mesh exposure, and office clipping of fibers.    I have told the patient that there is not one preferred route of care but that the clipping of the fibers can result in a failure of the sling and a recurrence in the stress incontinence. She know she is is not any significant risk for infection.  I do not recommend intercourse.   Continue with vaginal estrogen 1/2 gram pv 3 times  per week at hs.  Follow up in 2 weeks.   An After Visit Summary was printed and given to the patient.  ___15___ minutes face to face time of which over 50% was spent in counseling.

## 2015-06-16 ENCOUNTER — Encounter: Payer: Self-pay | Admitting: Obstetrics and Gynecology

## 2015-06-16 ENCOUNTER — Ambulatory Visit (INDEPENDENT_AMBULATORY_CARE_PROVIDER_SITE_OTHER): Payer: BLUE CROSS/BLUE SHIELD | Admitting: Obstetrics and Gynecology

## 2015-06-16 VITALS — BP 110/60 | HR 100 | Resp 12 | Wt 221.0 lb

## 2015-06-16 DIAGNOSIS — T83721D Exposure of implanted vaginal mesh and other prosthetic materials into vagina, subsequent encounter: Secondary | ICD-10-CM

## 2015-06-16 NOTE — Progress Notes (Signed)
GYNECOLOGY  VISIT   HPI: 55 y.o.   Married  African [place] female   [phone] with Patient's last menstrual period was 04/23/2003 (approximate).   here for  Follow up for TRANSVAGINAL TAPE (TVT) PROCEDURE (N/A ) CYSTOSCOPY (N/A ).  No bleeding or discomfort.  GYNECOLOGIC HISTORY: Patient's last menstrual period was 04/23/2003 (approximate). Contraception:Hysterectomy Menopausal hormone therapy: Estrace  Last mammogram:08/2014 normal per patient Last pap smear: 08/2014 normal per patient        OB History    Gravida Para Term Preterm AB TAB SAB Ectopic Multiple Living   4 3 3  1  1   3          Patient Active Problem List   Diagnosis Date Noted  . Primary osteoarthritis of both knees 08/19/2014  . Major depressive disorder, recurrent episode, severe, without mention of psychotic behavior 12/14/2012  . Anxiety disorder 12/14/2012  . Major depressive disorder ([place]) 11/12/2011    Past Medical History  Diagnosis Date  . TIA (transient ischemic attack) 10/04/2011  . Hypertension 2007  . Sleep apnea 2014  . UTI (urinary tract infection)   . Endometriosis   . Fibroid   . Anxiety   . Depression   . Diabetes mellitus, type II ([place])     12/31/2011--diet controlled  . Urinary incontinence   . BRCA negative     Past Surgical History  Procedure Laterality Date  . Foot surgery  2010    Removed cartilage from both feet  . Oophorectomy  2007    BSO--due to cysts  . Abdominal hysterectomy  2005    fibroids/endometriosis  . Bladder suspension N/A 04/04/2015    Procedure: TRANSVAGINAL TAPE (TVT) PROCEDURE;  Surgeon: Nunzio Cobbs, MD;  Location: [facility];  Service: Gynecology;  Laterality: N/A;  1.25 hours  . Cystoscopy N/A 04/04/2015    Procedure: CYSTOSCOPY;  Surgeon: Nunzio Cobbs, MD;  Location: [facility];  Service: Gynecology;  Laterality: N/A;    Current Outpatient Prescriptions  Medication Sig Dispense Refill  . atorvastatin (LIPITOR) 20  MG tablet Take 20 mg by mouth at bedtime.    . celecoxib (CELEBREX) 200 MG capsule One to 2 tablets by mouth daily as needed for pain. (Patient taking differently: Take 200-400 mg by mouth daily as needed for moderate pain. One to 2 tablets by mouth daily as needed for pain.) 90 capsule 4  . DULoxetine (CYMBALTA) 30 MG capsule Take 1 capsule by mouth daily.    Marland Kitchen estradiol (ESTRACE) 0.1 MG/GM vaginal cream Use 1/2 g vaginally every night for the first 2 weeks, then use 1/2 g vaginally at night twice a week. 42.5 g 1  . estradiol (ESTRACE) 1 MG tablet Take 1 mg by mouth daily.    . metFORMIN (GLUCOPHAGE) 500 MG tablet Take 500 mg by mouth daily.   3  . valsartan-hydrochlorothiazide (DIOVAN-HCT) 320-25 MG per tablet Take 1 tablet by mouth daily.    Marland Kitchen zolpidem (AMBIEN) 10 MG tablet Take 10 mg by mouth at bedtime as needed for sleep.     . metroNIDAZOLE (FLAGYL) 500 MG tablet Reported on 06/16/2015     No current facility-administered medications for this visit.     ALLERGIES: Review of patient's allergies indicates no known allergies.  Family History  Problem Relation Age of Onset  . Heart attack Mother   . Hypertension Mother   . Cancer Mother   . Stroke Mother   . Breast cancer Mother  dec age 31  . Stroke Father   . Stroke Brother   . Diabetes Brother   . Hypertension Brother   . Diabetes type II Sister 30  . Breast cancer Sister   . Hypertension Sister   . Diabetes type II Paternal Aunt   . Diabetes type II Paternal Uncle   . Miscarriages / [place] Daughter   . Stroke Brother   . Hypertension Brother   . Breast cancer Sister 45  . Diabetes Sister   . Breast cancer Sister 38    metastatic  . Hypertension Sister     Social History   Social History  . Marital Status: Married    Spouse Name: N/A  . Number of Children: N/A  . Years of Education: N/A   Occupational History  . Not on file.   Social History Main Topics  . Smoking status: Never Smoker   . Smokeless  tobacco: Never Used  . Alcohol Use: 0.0 oz/week    0 Standard drinks or equivalent per week     Comment: occasionally-1 drink every 2 months  . Drug Use: No  . Sexual Activity:    Partners: Male    Birth Control/ Protection: Surgical     Comment: TAH 2005/BSO 2007   Other Topics Concern  . Not on file   Social History Narrative    ROS:  Pertinent items are noted in HPI.  PHYSICAL EXAMINATION:    BP 110/60 mmHg  Pulse 100  Resp 12  Wt 221 lb (100.245 kg)  LMP 04/23/2003 (Approximate)    General appearance: alert, cooperative and appears stated age  Bright affect and smiling.   Pelvic: External genitalia:  no lesions              Urethra:  normal appearing urethra with no masses, tenderness or lesions              Bartholins and Skenes: normal                 Vagina: 7 mm area of mesh exposure of sling in midline.                 Cervix: absent                Bimanual Exam:  Uterus:  uterus absent              Adnexa: no mass, fullness, tenderness               Chaperone was present for exam.  ASSESSMENT  Vaginal mesh exposure.   PLAN  Counseled regarding mesh exposure.  Options reviewed of observational management or surgical closure of the vaginal mucosa over the sling and cystoscopy.  I do not recommend clipping the mesh as I think it will result in stress urinary incontinence due to the location of the exposure of it.  Risks of reoperation include bleeding, infection, damage to surrounding organs such as the urethra and bladder, poor healing resulting in recurrence of the mesh exposure, reaction to anesthesia, DVT, PE, death.  I have counseled the patient of the risks of female and female dyspareunia.  I do not believe that the use of the vaginal estrogen will result in any further coverage of the mesh.  She will discuss with her husband.  Annual exam due in 1 - 2 months.   An After Visit Summary was printed and given to the patient.  ___20___ minutes face to  face time of  which over 50% was spent in counseling.

## 2015-06-18 ENCOUNTER — Encounter: Payer: Self-pay | Admitting: Obstetrics and Gynecology

## 2015-06-19 ENCOUNTER — Telehealth: Payer: Self-pay | Admitting: Obstetrics and Gynecology

## 2015-06-19 NOTE — Telephone Encounter (Signed)
Patient calling, she would like to start process for surgical scheduling. Would like to know dates available and insurance coverage's.  Advised would check with Dr. Quincy Simmonds and insurance department and would return call.  Patient states she spoke with Ivar Drape on Friday and would like to speak with her again.

## 2015-06-19 NOTE — Telephone Encounter (Signed)
Return call to patient. Desires to proceed with procedure as soon as possible after 07-10-15. Very anxious to get scheduled.  Advised will review with Dr Quincy Simmonds for scheduling instructions and call her back.

## 2015-06-19 NOTE — Telephone Encounter (Signed)
Procedure for patient with be vaginal incision revision and cystoscopy.  Diagnosis is vaginal mesh exposure following midurethral sling.   Please precert and schedule.  Time needed is about 30 minutes.   Cc - Lerry Liner

## 2015-06-19 NOTE — Telephone Encounter (Signed)
Patient was last seen 06/16/15 and said Dr.Silva told her to call today and ask for [facility]. No further details given, no open phone note.

## 2015-06-22 ENCOUNTER — Telehealth: Payer: Self-pay | Admitting: Obstetrics and Gynecology

## 2015-06-22 NOTE — Telephone Encounter (Signed)
Spoke with patient this morning and deposit was paid for surgery. Patient has called back stating she wants to delay surgery. Routing to [place] to review.

## 2015-06-22 NOTE — Telephone Encounter (Signed)
Spoke with pt regarding benefit for surgery. Patient understood and agreeable. Patient ready to schedule. Patient provided surgery deposit over the phone. Patient aware this is professional benefit only. Patient aware will be contacted by hospital for separate benefits. Staff message to Sally for scheduling °

## 2015-06-22 NOTE — Telephone Encounter (Signed)
Reviewed with Dr Quincy Simmonds. Dr Quincy Simmonds has discussed options with patient and observation is an option. Recommends follow-up at annual exam in 2 months.  Call to patient, advised of recommendation from Dr Quincy Simmonds. Scheduled annual exam for 08-14-15.   Routing to provider for final review. Patient agreeable to disposition. Will close encounter.

## 2015-06-22 NOTE — Telephone Encounter (Signed)
Previously spoke with patient regarding surgery benefits. Surgery will not be scheduled at this time and surgery deposit transaction taken today has been voided

## 2015-08-09 ENCOUNTER — Other Ambulatory Visit: Payer: Self-pay

## 2015-08-09 ENCOUNTER — Telehealth: Payer: Self-pay | Admitting: Obstetrics and Gynecology

## 2015-08-09 DIAGNOSIS — Z1231 Encounter for screening mammogram for malignant neoplasm of breast: Secondary | ICD-10-CM

## 2015-08-09 NOTE — Telephone Encounter (Signed)
Patient is asking to talk with Dr.Silva's nurse before coming to her aex and 2 month follow up appointment Monday.

## 2015-08-09 NOTE — Telephone Encounter (Signed)
Spoke with patient. Patient states that she would like to have her mammogram performed on 08/14/2015 as she will be traveling into town for her appointment with Dr.Silva. She is unsure who she has seen in the past for her mammograms.Provided telephone number to [facility] and [facility]. Denies any current breast problems. She will call to schedule her screening mammogram for 08/14/2015. Will return call to office if she has any additional questions or needs assistance with scheduling.  Routing to provider for final review. Patient agreeable to disposition. Will close encounter.

## 2015-08-09 NOTE — Telephone Encounter (Signed)
Left message to call Kaitlyn at [phone]. 

## 2015-08-14 ENCOUNTER — Ambulatory Visit (INDEPENDENT_AMBULATORY_CARE_PROVIDER_SITE_OTHER): Payer: BLUE CROSS/BLUE SHIELD | Admitting: Obstetrics and Gynecology

## 2015-08-14 ENCOUNTER — Encounter: Payer: Self-pay | Admitting: Obstetrics and Gynecology

## 2015-08-14 VITALS — BP 110/64 | HR 76 | Resp 18 | Ht 66.0 in | Wt 226.0 lb

## 2015-08-14 DIAGNOSIS — T83721D Exposure of implanted vaginal mesh and other prosthetic materials into vagina, subsequent encounter: Secondary | ICD-10-CM

## 2015-08-14 MED ORDER — ESTRADIOL 0.1 MG/GM VA CREA: TOPICAL_CREAM | VAGINAL | Status: DC

## 2015-08-14 NOTE — Progress Notes (Signed)
Patient ID: Deanna Caldwell, female   DOB: 12-01-1960, 55 y.o.   MRN: [id number] GYNECOLOGY  VISIT   HPI: 55 y.o.   Married  [place] American  female   [phone] with Patient's last menstrual period was 04/23/2003 (approximate).   here for follow up.    Has exposure of vaginal mesh through vaginal wall from TVT exact midurethral sling.   Working out a lot - cardio and [employer].  Riding bike.  No leak with exercise.  Cannot hold her urine and can leak with this.  Lower abdominal pulling after exercise.   Notices this when she started working out again.  Stretching makes it better.   Husband can feel the erosion with intercourse.  Patient does not feel pain with intercourse.   Not having any pain overall.   Some drainage from vagina.  No odor.   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     GYNECOLOGIC HISTORY: Patient's last menstrual period was 04/23/2003 (approximate). Contraception:  Hysterectomy Menopausal hormone therapy:  None Last mammogram:  08/2014 normal per patient Last pap smear: 08/2014 normal per patient          OB History    Gravida Para Term Preterm AB TAB SAB Ectopic Multiple Living   4 3 3  1  1   3          Patient Active Problem List   Diagnosis Date Noted  . Primary osteoarthritis of both knees 08/19/2014  . Major depressive disorder, recurrent episode, severe, without mention of psychotic behavior 12/14/2012  . Anxiety disorder 12/14/2012  . Major depressive disorder ([place]) 11/12/2011    Past Medical History  Diagnosis Date  . TIA (transient ischemic attack) 10/04/2011  . Hypertension 2007  . Sleep apnea 2014  . UTI (urinary tract infection)   . Endometriosis   . Fibroid   . Anxiety   . Depression   . Diabetes mellitus, type II ([place])     12/31/2011--diet controlled  . Urinary incontinence   . BRCA negative     Past Surgical History  Procedure Laterality Date  . Foot surgery  2010    Removed cartilage from both feet  .  Oophorectomy  2007    BSO--due to cysts  . Abdominal hysterectomy  2005    fibroids/endometriosis  . Bladder suspension N/A 04/04/2015    Procedure: TRANSVAGINAL TAPE (TVT) PROCEDURE;  Surgeon: Nunzio Cobbs, MD;  Location: [facility];  Service: Gynecology;  Laterality: N/A;  1.25 hours  . Cystoscopy N/A 04/04/2015    Procedure: CYSTOSCOPY;  Surgeon: Nunzio Cobbs, MD;  Location: [facility];  Service: Gynecology;  Laterality: N/A;    Current Outpatient Prescriptions  Medication Sig Dispense Refill  . atorvastatin (LIPITOR) 20 MG tablet Take 20 mg by mouth at bedtime.    . celecoxib (CELEBREX) 200 MG capsule One to 2 tablets by mouth daily as needed for pain. (Patient taking differently: Take 200-400 mg by mouth daily as needed for moderate pain. One to 2 tablets by mouth daily as needed for pain.) 90 capsule 4  . DULoxetine (CYMBALTA) 30 MG capsule Take 1 capsule by mouth daily.    Marland Kitchen estradiol (ESTRACE) 1 MG tablet Take 1 mg by mouth daily.    . metFORMIN (GLUCOPHAGE) 500 MG tablet Take 500 mg by mouth daily.   3  . valsartan-hydrochlorothiazide (DIOVAN-HCT) 320-25 MG per tablet Take 1 tablet by mouth daily.    Marland Kitchen zolpidem (AMBIEN) 10 MG  tablet Take 10 mg by mouth at bedtime as needed for sleep.      No current facility-administered medications for this visit.     ALLERGIES: Review of patient's allergies indicates no known allergies.  Family History  Problem Relation Age of Onset  . Heart attack Mother   . Hypertension Mother   . Cancer Mother   . Stroke Mother   . Breast cancer Mother     dec age 46  . Stroke Father   . Stroke Brother   . Diabetes Brother   . Hypertension Brother   . Diabetes type II Sister 41  . Breast cancer Sister   . Hypertension Sister   . Diabetes type II Paternal Aunt   . Diabetes type II Paternal Uncle   . Miscarriages / [place] Daughter   . Stroke Brother   . Hypertension Brother   . Breast cancer Sister 20  . Diabetes Sister    . Breast cancer Sister 84    metastatic  . Hypertension Sister     Social History   Social History  . Marital Status: Married    Spouse Name: N/A  . Number of Children: N/A  . Years of Education: N/A   Occupational History  . Not on file.   Social History Main Topics  . Smoking status: Never Smoker   . Smokeless tobacco: Never Used  . Alcohol Use: 0.0 oz/week    0 Standard drinks or equivalent per week     Comment: occasionally-1 drink every 2 months  . Drug Use: No  . Sexual Activity:    Partners: Male    Birth Control/ Protection: Surgical     Comment: TAH 2005/BSO 2007   Other Topics Concern  . Not on file   Social History Narrative    ROS:  Pertinent items are noted in HPI.  PHYSICAL EXAMINATION:    BP 110/64 mmHg  Pulse 76  Resp 18  Ht 5' 6"  (1.676 m)  Wt 226 lb (102.513 kg)  BMI 36.49 kg/m2  LMP 04/23/2003 (Approximate)    General appearance: alert, cooperative and appears stated age    Pelvic: External genitalia:  no lesions              Urethra:  normal appearing urethra with no masses, tenderness or lesions              Bartholins and Skenes: normal                 Vagina: 7 mm square area of midurethral sling exposure.               Cervix: absent      Bimanual Exam:  Uterus:  uterus absent              Adnexa: normal adnexa and no mass, fullness, tenderness               Chaperone was present for exam.  ASSESSMENT  Vaginal exposure of midurethral sling mesh.  Continent of urine.   PLAN  I discussed cutting of the fibers versus a vaginal mucosal revision. (She will need a cystoscopy if opts for vaginal mucosal revision.) I am in favor of the vaginal mucosal revision.  I discussed risks of bleeding, infection, recurrent exposure of the mesh, reactions to anesthesia, pneumonia, time out of work for healing from surgery is about 1 week but no lifting over 10 pounds until the area has completely healed.  I do recommend using  estrace vaginal  cream 1/2 gm pv three times per week to prepare the vaginal tissue for potential surgical care.  Patient inclined to proceed with surgery, and she will discuss with her husband.  We did discuss returning to her usual work out routine gradually.  She may be deconditioned following her surgery recovery and inactivity.    An After Visit Summary was printed and given to the patient.  ___25___ minutes face to face time of which over 50% was spent in counseling.

## 2015-08-29 ENCOUNTER — Ambulatory Visit: Payer: BLUE CROSS/BLUE SHIELD | Admitting: Podiatry

## 2015-09-05 ENCOUNTER — Ambulatory Visit (INDEPENDENT_AMBULATORY_CARE_PROVIDER_SITE_OTHER): Payer: BLUE CROSS/BLUE SHIELD | Admitting: Podiatry

## 2015-09-05 ENCOUNTER — Ambulatory Visit (INDEPENDENT_AMBULATORY_CARE_PROVIDER_SITE_OTHER): Payer: BLUE CROSS/BLUE SHIELD

## 2015-09-05 ENCOUNTER — Encounter: Payer: Self-pay | Admitting: Podiatry

## 2015-09-05 VITALS — BP 123/81 | HR 87 | Resp 12

## 2015-09-05 DIAGNOSIS — M79672 Pain in left foot: Secondary | ICD-10-CM

## 2015-09-05 DIAGNOSIS — M6740 Ganglion, unspecified site: Secondary | ICD-10-CM | POA: Diagnosis not present

## 2015-09-05 NOTE — Progress Notes (Signed)
She presents today for follow-up of her left foot. States that the surgery has done very well now she is experiencing to 3 different spots that are painful.  Objective: Vital signs are stable alert and oriented 3 have reviewed her past medical history medications allergies surgeries and social history. Pulses are strongly palpable. She has a very small nonpulsatile nodule overlying the first metatarsophalangeal joint just lateral to the extensor hallucis longus tendon. She also has a larger nonpulsatile mass overlying the anterior process of the calcaneus at the level of the sinus tarsi. This is just beneath the extensor digitorum brevis muscle belly. These appear to be fluctuant nonpulsatile lesions consistent with ganglion cysts radiographs do not demonstrate any type of osseus abnormalities well-placed Keller arthroplasty of the single silicone implant without bony regrowth.  Assessment: Ganglion cyst dorsal aspect first metatarsophalangeal joint left and dorsal lateral aspect of the left foot.  Plan: Aspirations of these were performed today obtaining a proximally 1 mL of clear gelatinous synovial fluid combined from both regions after local anesthetic and then administered and after Betadine swab had been performed. I will follow-up with her on an as-needed basis for possible surgical excision. We did discuss weight loss programs today. She states that her diabetes states under good control.

## 2015-09-15 ENCOUNTER — Ambulatory Visit: Payer: Self-pay

## 2015-09-15 ENCOUNTER — Ambulatory Visit: Payer: BLUE CROSS/BLUE SHIELD | Admitting: Obstetrics and Gynecology

## 2015-11-23 DIAGNOSIS — I10 Essential (primary) hypertension: Secondary | ICD-10-CM | POA: Insufficient documentation

## 2015-11-23 DIAGNOSIS — E119 Type 2 diabetes mellitus without complications: Secondary | ICD-10-CM | POA: Insufficient documentation

## 2015-11-23 DIAGNOSIS — E6609 Other obesity due to excess calories: Secondary | ICD-10-CM | POA: Insufficient documentation

## 2015-11-23 DIAGNOSIS — E782 Mixed hyperlipidemia: Secondary | ICD-10-CM | POA: Insufficient documentation

## 2015-12-06 ENCOUNTER — Telehealth: Payer: Self-pay | Admitting: Obstetrics and Gynecology

## 2015-12-06 NOTE — Telephone Encounter (Signed)
Patient said "I am experiencing some bleeding from the surgery that I had last year." Patient is asking to talk with Dr.Silva's nurse.

## 2015-12-06 NOTE — Telephone Encounter (Signed)
Patient had TVT procedure on 04/04/2015 with Dr.Silva. She was last seen in the office on 08/14/2015 and has exposure of vaginal mesh through vaginal wall from TVT. Vaginal mucosa revision was discussed. Patient is calling to report that over the last 3 days she has been experiencing mild cramping with light spotting. Denies heavy bleeding or sharp pain. Patient has been using Estrace 1/2 gram 3 times a week. Advised patient she will need to be seen I the office for further evaluation. She is agreeable. Appointment scheduled for tomorrow 12/07/2015 at 3:30 pm with Dr.Silva. She is agreeable to date and time.  Routing to provider for final review. Patient agreeable to disposition. Will close encounter.

## 2015-12-07 ENCOUNTER — Encounter: Payer: Self-pay | Admitting: Obstetrics and Gynecology

## 2015-12-07 ENCOUNTER — Ambulatory Visit (INDEPENDENT_AMBULATORY_CARE_PROVIDER_SITE_OTHER): Payer: BLUE CROSS/BLUE SHIELD | Admitting: Obstetrics and Gynecology

## 2015-12-07 VITALS — BP 114/68 | HR 72 | Resp 14 | Ht 66.0 in | Wt 211.0 lb

## 2015-12-07 DIAGNOSIS — T83418D Breakdown (mechanical) of other prosthetic devices, implants and grafts of genital tract, subsequent encounter: Secondary | ICD-10-CM | POA: Diagnosis not present

## 2015-12-07 DIAGNOSIS — R8290 Unspecified abnormal findings in urine: Secondary | ICD-10-CM | POA: Diagnosis not present

## 2015-12-07 DIAGNOSIS — N939 Abnormal uterine and vaginal bleeding, unspecified: Secondary | ICD-10-CM

## 2015-12-07 DIAGNOSIS — T83712D Erosion of implanted urethral mesh to surrounding organ or tissue, subsequent encounter: Secondary | ICD-10-CM

## 2015-12-07 NOTE — Progress Notes (Signed)
GYNECOLOGY  VISIT   HPI: 55 y.o.   Married  [place] American  female   [phone] with Patient's last menstrual period was 04/23/2003 (approximate).   here for   Light spotting - started Thursday / cramping Saturday.  Has known mesh midurethral sling erosion which she has been following conservatively.   Has been working out.   No pain with intercourse.  No female or female dyspareunia.  Husband had notice the mesh.  Has been using vaginal estrogen cream but stopped when she saw the blood.  No dysuria.  No urinary frequency.  No hematuria.   Normal bowel function.   Urine dip - trace leukocytes.  GYNECOLOGIC HISTORY: Patient's last menstrual period was 04/23/2003 (approximate). Contraception:  Hysterectomy Menopausal hormone therapy:  Estrace Last mammogram:  10/2015 per patient nomal Last pap smear:   08/2014 normal per patient        OB History    Gravida Para Term Preterm AB Living   4 3 3   1 3    SAB TAB Ectopic Multiple Live Births   1                 Patient Active Problem List   Diagnosis Date Noted  . Primary osteoarthritis of both knees 08/19/2014  . Major depressive disorder, recurrent episode, severe, without mention of psychotic behavior 12/14/2012  . Anxiety disorder 12/14/2012  . Major depressive disorder ([place]) 11/12/2011    Past Medical History:  Diagnosis Date  . Anxiety   . BRCA negative   . Depression   . Diabetes mellitus, type II ([place])    12/31/2011--diet controlled  . Endometriosis   . Fibroid   . Hypertension 2007  . Sleep apnea 2014  . TIA (transient ischemic attack) 10/04/2011  . Urinary incontinence   . UTI (urinary tract infection)     Past Surgical History:  Procedure Laterality Date  . ABDOMINAL HYSTERECTOMY  2005   fibroids/endometriosis  . BLADDER SUSPENSION N/A 04/04/2015   Procedure: TRANSVAGINAL TAPE (TVT) PROCEDURE;  Surgeon: Nunzio Cobbs, MD;  Location: [facility];  Service: Gynecology;  Laterality: N/A;  1.25  hours  . CYSTOSCOPY N/A 04/04/2015   Procedure: CYSTOSCOPY;  Surgeon: Nunzio Cobbs, MD;  Location: [facility];  Service: Gynecology;  Laterality: N/A;  . foot surgery  2010   Removed cartilage from both feet  . OOPHORECTOMY  2007   BSO--due to cysts    Current Outpatient Prescriptions  Medication Sig Dispense Refill  . atorvastatin (LIPITOR) 20 MG tablet Take 20 mg by mouth at bedtime.    . celecoxib (CELEBREX) 200 MG capsule One to 2 tablets by mouth daily as needed for pain. (Patient taking differently: Take 200-400 mg by mouth daily as needed for moderate pain. One to 2 tablets by mouth daily as needed for pain.) 90 capsule 4  . DULoxetine (CYMBALTA) 30 MG capsule Take 1 capsule by mouth daily.    Marland Kitchen estradiol (ESTRACE) 0.1 MG/GM vaginal cream Uuse 1/2 g vaginally at night two to three times a week. 42.5 g 0  . estradiol (ESTRACE) 1 MG tablet Take 1 mg by mouth daily.    . metFORMIN (GLUCOPHAGE) 500 MG tablet Take 500 mg by mouth daily.   3  . valsartan-hydrochlorothiazide (DIOVAN-HCT) 320-25 MG per tablet Take 1 tablet by mouth daily.    Marland Kitchen zolpidem (AMBIEN) 10 MG tablet Take 10 mg by mouth at bedtime as needed for sleep.  No current facility-administered medications for this visit.      ALLERGIES: Review of patient's allergies indicates no known allergies.  Family History  Problem Relation Age of Onset  . Heart attack Mother   . Hypertension Mother   . Cancer Mother   . Stroke Mother   . Breast cancer Mother     dec age 31  . Stroke Father   . Stroke Brother   . Diabetes Brother   . Hypertension Brother   . Diabetes type II Sister 39  . Breast cancer Sister   . Hypertension Sister   . Diabetes type II Paternal Aunt   . Diabetes type II Paternal Uncle   . Miscarriages / [place] Daughter   . Stroke Brother   . Hypertension Brother   . Breast cancer Sister 25  . Diabetes Sister   . Breast cancer Sister 13    metastatic  . Hypertension Sister      Social History   Social History  . Marital status: Married    Spouse name: N/A  . Number of children: N/A  . Years of education: N/A   Occupational History  . Not on file.   Social History Main Topics  . Smoking status: Never Smoker  . Smokeless tobacco: Never Used  . Alcohol use 0.0 oz/week     Comment: occasionally-1 drink every 2 months  . Drug use: No  . Sexual activity: Yes    Partners: Male    Birth control/ protection: Surgical     Comment: TAH 2005/BSO 2007   Other Topics Concern  . Not on file   Social History Narrative  . No narrative on file    ROS:  Pertinent items are noted in HPI.  PHYSICAL EXAMINATION:    BP 114/68 (BP Location: Right Arm, Patient Position: Sitting, Cuff Size: Normal)   Pulse 72   Resp 14   Ht 5' 6"  (1.676 m)   Wt 211 lb (95.7 kg)   LMP 04/23/2003 (Approximate)   BMI 34.06 kg/m     General appearance: alert, cooperative and appears stated age   Pelvic: External genitalia:  no lesions              Urethra:  normal appearing urethra with no masses, tenderness or lesions              Bartholins and Skenes: normal                 Vagina: normal appearing vagina with normal color and discharge,  4 mm sling exposure of the anterior vaginal wall.  No bleeding or lesions noted of the mucosa.              Cervix: absent                Bimanual Exam:  Uterus:   Absent.               Adnexa: no mass, fullness, tenderness              Chaperone was present for exam.  ASSESSMENT   Vaginal bleeding.  I suspect this may have been from using vaginal estrogen cream applicator. Minor sling erosion.  Abnormal urine.  Vaginal atrophy.  On Cymbalta.  PLAN  Discussion of vaginal bleeding and likely etiology.  OK to continue observational management of the sling erosion which at this point is very minor.  Urine culture.  Follow up for annual exam in April 2018 and prn.  An After Visit Summary was printed and given to the  patient.  _15_____ minutes face to face time of which over 50% was spent in counseling.

## 2015-12-09 LAB — URINE CULTURE

## 2015-12-26 ENCOUNTER — Telehealth: Payer: Self-pay | Admitting: Obstetrics and Gynecology

## 2015-12-26 NOTE — Telephone Encounter (Signed)
Spoke with patient. Patient reports yellow vaginal discharge with odor and vaginal irritation that started on 12/22/2015. Patient denies pelvic pain. Reports she feels that this is a bacterial infection as she has had in the past. She is requesting RX for flagyl. Advised patient we are unable to prescribe this medication over the telephone. Encouraged office visit to ensure proper diagnosis. Patient reports that she discussed this with Dr. Quincy Simmonds at last visit. Encouraged patient to be seen at urgent care, but patient states she is unsure of urgent care location in [place]. Advised I would speak with covering provider regarding medication request and return call. Patient agreeable.

## 2015-12-26 NOTE — Telephone Encounter (Signed)
Patient is asking for a refill of metronidazol? Patient is out of town and would like this called to [facility], [place] , [place]

## 2015-12-26 NOTE — Telephone Encounter (Signed)
I've reviewed the chart. The patient has a h/o vaginal atrophy and sling erosion. I would recommend she be evaluated prior to treating with antibiotics.

## 2015-12-26 NOTE — Telephone Encounter (Signed)
Spoke with patient advised of message as seen below from Dr. Talbert Nan. Patient is agreeable and states she would like to wait and be seen when she returns from her trip. States she will contact the office and make an appointment with Dr. Quincy Simmonds if needed.   Cc: Dr. Quincy Simmonds  Routing to covering provider for final review. Patient is agreeable to disposition. Will close encounter.

## 2016-02-12 ENCOUNTER — Encounter: Payer: Self-pay | Admitting: Obstetrics and Gynecology

## 2016-03-12 ENCOUNTER — Ambulatory Visit (INDEPENDENT_AMBULATORY_CARE_PROVIDER_SITE_OTHER): Payer: BLUE CROSS/BLUE SHIELD | Admitting: Podiatry

## 2016-03-12 ENCOUNTER — Encounter: Payer: Self-pay | Admitting: Podiatry

## 2016-03-12 VITALS — BP 92/55 | HR 77 | Resp 16

## 2016-03-12 DIAGNOSIS — M6740 Ganglion, unspecified site: Secondary | ICD-10-CM

## 2016-03-12 NOTE — Progress Notes (Signed)
She presents today for follow-up of her ganglion cyst to the dorsal lateral aspect of the left foot and the first metatarsophalangeal joint. She states this seems to be doing better and she is not having any pain with it.  Objective: Vital signs are stable alert and oriented 3. Pulses are palpable. Neurologic sensorium is intact. She does have some loss of sensation occasionally overlying the fourth and fifth digits of the left foot possibly associated with intermediate cutaneous nerve entrapment secondary to the ganglion overlying the dorsal lateral aspect of the foot. Neither of these ganglions have increased in size.  Assessment: Ganglion cyst left foot.  Plan: Consider surgical intervention for the ganglion cysts also we would need to consider another set of x-rays. She is also considering having the corns removed.

## 2016-03-27 DIAGNOSIS — M6790 Unspecified disorder of synovium and tendon, unspecified site: Secondary | ICD-10-CM

## 2016-04-25 ENCOUNTER — Ambulatory Visit (INDEPENDENT_AMBULATORY_CARE_PROVIDER_SITE_OTHER): Payer: BLUE CROSS/BLUE SHIELD | Admitting: Podiatry

## 2016-04-25 ENCOUNTER — Encounter: Payer: Self-pay | Admitting: Podiatry

## 2016-04-25 DIAGNOSIS — M6740 Ganglion, unspecified site: Secondary | ICD-10-CM

## 2016-04-25 NOTE — Patient Instructions (Signed)

## 2016-04-28 NOTE — Progress Notes (Signed)
She presents today for surgical consult regarding ganglion cysts to her left foot. She states that they're becoming more painful and she like to have them removed.  Objective: Vital signs are stable alert and oriented 3. Pulses are palpable. Left foot demonstrate strong palpable pulses large nodular mass to the lateral aspect overlying the inferior sinus tarsi left foot. It measures approximately 2 cm in diameter is nonpulsatile in nature feels like a fluid-filled ganglion cyst. She has a small ganglion cyst overlying the first metatarsophalangeal joint of the left foot as well. Both of these are tender on palpation and pressure.  Assessment: Ganglion cyst left foot 2.  Plan: We discussed surgical intervention today consisting of a excision ganglion cysts 2 left foot I answered all questions regarding the sutures best mobility limitations to his mental toilet she also understands that she will have approximately 10-15% chance of recurrence of the cysts. She may have all complications that are associated with anesthesia which we discussed. We also discussed overcorrection under correction postop pain and swelling infection recurrence need for further surgery loss of digit loss of limb also alive.  We dispensed all of the paperwork necessary for surgery today including the consent form [place] information and information regarding anesthesia.Marland Kitchen

## 2016-05-02 ENCOUNTER — Telehealth: Payer: Self-pay | Admitting: *Deleted

## 2016-05-02 NOTE — Telephone Encounter (Signed)
"  I'm calling to schedule a date to have my surgery with Dr. Milinda Pointer.  If you would, give me a call back."

## 2016-05-07 NOTE — Telephone Encounter (Signed)
I am calling you back to inform you that Dr. Milinda Pointer cannot do surgery on February 2.  He can do it on February 9.  "I hate that because we had already scheduled to be off on the second.  Can he do it January 26?"  Yes, he can do it on January 26.  "Let me check with my husband and see which date is better the January 26 or February 9 and I will call you back tomorrow morning."

## 2016-05-10 NOTE — Telephone Encounter (Signed)
"  I am calling to confirm that I want surgery on February 9 with Dr. Milinda Pointer.  You said that February 2, he is not doing surgery correct?"  Yes, that is correct February 2 is not available.  I will get it scheduled.

## 2016-05-27 ENCOUNTER — Telehealth: Payer: Self-pay | Admitting: *Deleted

## 2016-05-27 DIAGNOSIS — R52 Pain, unspecified: Secondary | ICD-10-CM

## 2016-05-27 NOTE — Telephone Encounter (Signed)
"  I am calling to get some information regarding patient's surgery that took place on Friday, February 2nd.  Please give me a call.  I will fax the request in case that is a requirement for your office."

## 2016-05-29 ENCOUNTER — Other Ambulatory Visit: Payer: Self-pay | Admitting: Podiatry

## 2016-05-29 MED ORDER — OXYCODONE-ACETAMINOPHEN 10-325 MG PO TABS: 1.0000 | ORAL_TABLET | ORAL | 0 refills | Status: DC | PRN

## 2016-05-29 MED ORDER — ONDANSETRON HCL 4 MG PO TABS: 4.0000 mg | ORAL_TABLET | Freq: Three times a day (TID) | ORAL | 0 refills | Status: DC | PRN

## 2016-05-29 MED ORDER — CEPHALEXIN 500 MG PO CAPS: 500.0000 mg | ORAL_CAPSULE | Freq: Three times a day (TID) | ORAL | 0 refills | Status: DC

## 2016-05-31 ENCOUNTER — Encounter: Payer: Self-pay | Admitting: Podiatry

## 2016-05-31 DIAGNOSIS — M6740 Ganglion, unspecified site: Secondary | ICD-10-CM | POA: Diagnosis not present

## 2016-06-03 ENCOUNTER — Telehealth: Payer: Self-pay | Admitting: *Deleted

## 2016-06-03 NOTE — Telephone Encounter (Signed)
"  Calling to obtain surgery confirmation to approve short term disability.  I need the cpt code, icd-10 code and next office visit.  If you would return my call.  In the meantime I will also send a fax request."

## 2016-06-05 ENCOUNTER — Encounter: Payer: Self-pay | Admitting: Podiatry

## 2016-06-06 ENCOUNTER — Ambulatory Visit (INDEPENDENT_AMBULATORY_CARE_PROVIDER_SITE_OTHER): Payer: Self-pay | Admitting: Podiatry

## 2016-06-06 VITALS — Temp 97.9°F

## 2016-06-06 DIAGNOSIS — M6740 Ganglion, unspecified site: Secondary | ICD-10-CM

## 2016-06-06 MED ORDER — OXYCODONE-ACETAMINOPHEN 10-325 MG PO TABS: 1.0000 | ORAL_TABLET | ORAL | 0 refills | Status: DC | PRN

## 2016-06-06 NOTE — Progress Notes (Signed)
She presents today 1 week status post excision ganglion dorsal aspect of the first metatarsophalangeal joint left and lateral aspect of the left foot. She states that she is still having considerable pain. Otherwise she is doing fine denies any calf pain chest pain shortness of breath.  Objective: Dry sterile dressing was removed demonstrates no erythema edema cellulitis drainage or odor incision site is intact and closed well coapted with sutures.  Assessment: Well-healing surgical foot left.  Plan: Redressed her today dressed with compressive dressing providing more narcotics follow up with her in 1 week.

## 2016-06-13 ENCOUNTER — Ambulatory Visit (INDEPENDENT_AMBULATORY_CARE_PROVIDER_SITE_OTHER): Payer: Self-pay | Admitting: Podiatry

## 2016-06-13 DIAGNOSIS — M6740 Ganglion, unspecified site: Secondary | ICD-10-CM

## 2016-06-13 NOTE — Progress Notes (Signed)
She presents today 2 weeks status post excision ganglion's to the left foot. She states that she is doing very well she has minimal pain.  Objective: Vital signs are stable alert and oriented 3. Sutures are intact margins. We well coapted however when I was removing the lateral most aspect sutures there was some mild dehiscence so I placed Steri-Strips and encouraged her to keep it dry for the next day or so then she can start washing it. I will follow-up with her in a week to remove the remainder of the sutures. I saw no signs of infection today and has minimal pain.  Assessment: Well-healing surgical foot.  Plan: Follow up with her in a week for removal of the remainder of the sutures to the left foot. And place her in a compression anklet at that time.

## 2016-06-20 ENCOUNTER — Ambulatory Visit (INDEPENDENT_AMBULATORY_CARE_PROVIDER_SITE_OTHER): Payer: Self-pay | Admitting: Podiatry

## 2016-06-20 ENCOUNTER — Encounter: Payer: Self-pay | Admitting: Podiatry

## 2016-06-20 DIAGNOSIS — M6740 Ganglion, unspecified site: Secondary | ICD-10-CM

## 2016-06-21 ENCOUNTER — Telehealth: Payer: Self-pay | Admitting: *Deleted

## 2016-06-21 NOTE — Telephone Encounter (Addendum)
Pt states Dr. Milinda Pointer removed her stitches yesterday and she is having a little different pain today, and she did begin work today. I left message informing pt that she was probably experiencing discomfort from having her foot below her heart and not being able to rest through the day as she would rest at home. I told pt to rest, ice and elevate for comfort and to call with concerns.06/26/2016-Pt request refill of pain medication. Dr.Hyatt okayed refill for 5 days of the Percocet. I informed pt and she will pick up in the [place] office.

## 2016-06-22 NOTE — Progress Notes (Signed)
She presents today for follow-up of excision ganglion cyst that his surgery 05/31/2016. She states that she is doing well. Feels okay.  Objective: Vital signs are stable she's alert and oriented 3 margins are well coapted removed the remainder of the sutures today. No signs of infection.  Assessment: Well-healing surgical foot left.  Plan: Follow up with me in a couple of weeks. We will allow her to get back to regular shoe gear.

## 2016-06-25 MED ORDER — OXYCODONE-ACETAMINOPHEN 10-325 MG PO TABS: 1.0000 | ORAL_TABLET | ORAL | 0 refills | Status: DC | PRN

## 2016-06-25 NOTE — Telephone Encounter (Signed)
Ok to refill pain med for 5 day only

## 2016-06-25 NOTE — Addendum Note (Signed)
Addended by: Harriett Sine D on: 06/25/2016 04:49 PM   Modules accepted: Orders

## 2016-07-18 ENCOUNTER — Ambulatory Visit: Payer: BLUE CROSS/BLUE SHIELD | Admitting: Podiatry

## 2016-08-01 ENCOUNTER — Ambulatory Visit (INDEPENDENT_AMBULATORY_CARE_PROVIDER_SITE_OTHER): Payer: Self-pay | Admitting: Podiatry

## 2016-08-01 DIAGNOSIS — M6740 Ganglion, unspecified site: Secondary | ICD-10-CM

## 2016-08-04 NOTE — Progress Notes (Signed)
She presents today for follow-up of her surgery date 06/11/2016 excision ganglion cyst first metatarsophalangeal joint and lateral aspect of left foot states that she is doing very well.  Objective: No reproducible pain on palpation today. She has good incision healing. Minimal scarring.  Assessment: 1 surgical foot.  Plan: Follow up with me as needed.

## 2016-08-07 NOTE — Telephone Encounter (Signed)
Erroneous encounter

## 2016-09-13 NOTE — Progress Notes (Signed)
1. Excision ganglion cyst dorsal 1 MTPJ left foot 2. Excision ganglion cyst lateral foot left

## 2016-09-24 ENCOUNTER — Encounter: Payer: Self-pay | Admitting: Podiatry

## 2016-09-24 ENCOUNTER — Ambulatory Visit (INDEPENDENT_AMBULATORY_CARE_PROVIDER_SITE_OTHER): Payer: BLUE CROSS/BLUE SHIELD | Admitting: Podiatry

## 2016-09-24 ENCOUNTER — Ambulatory Visit (INDEPENDENT_AMBULATORY_CARE_PROVIDER_SITE_OTHER): Payer: BLUE CROSS/BLUE SHIELD

## 2016-09-24 DIAGNOSIS — M7662 Achilles tendinitis, left leg: Secondary | ICD-10-CM

## 2016-09-24 DIAGNOSIS — S92002A Unspecified fracture of left calcaneus, initial encounter for closed fracture: Secondary | ICD-10-CM

## 2016-09-24 DIAGNOSIS — M6740 Ganglion, unspecified site: Secondary | ICD-10-CM

## 2016-09-24 MED ORDER — OXYCODONE-ACETAMINOPHEN 10-325 MG PO TABS: 1.0000 | ORAL_TABLET | Freq: Four times a day (QID) | ORAL | 0 refills | Status: DC | PRN

## 2016-09-25 NOTE — Progress Notes (Signed)
She presents today and states that she's had some severe pain in the posterior aspect of her left leg and heel for the past few days. She denies any trauma. She states there is red and swollen painful and she can hardly get around on it. She still has some scar pain overlying the lateral aspect of her left foot associated with excision of a ganglion cyst.  Objective: Vital signs are stable she is alert and oriented 3. Pulses are palpable. Neurologic sensorium is intact. Deep tendon reflexes are intact. Muscle strength is intact. Orthopedic evaluation was resolved with distal ankle range of motion without crepitation. She has pain on palpation of the posterior aspect of the calcaneus radiographs taken today do demonstrate an avulsion of the posterior aspect of the calcaneus. This is a posterior superior avulsion appears that there has been partial avulsion before and that there is areas of calcification near the edge of the bone itself.  Assessment: Avulsion of the posterior calcaneus possibly an old spur.  Plan: I encouraged her to get into her Cam Walker and stay in for the next 4 weeks and then we will re-x-ray her again.

## 2016-09-27 ENCOUNTER — Telehealth: Payer: Self-pay | Admitting: *Deleted

## 2016-09-27 ENCOUNTER — Encounter: Payer: Self-pay | Admitting: *Deleted

## 2016-09-27 NOTE — Telephone Encounter (Addendum)
Pt states she was diagnosed with a left heel fracture 09/24/2016 and is unable to take the pain medication at work, pt states she would like 14 working days, and would like paperwork completed for short term disability. I informed pt that the letter is available for pick up. Pt states her husband will pick up today. Pt's husband picked up pt's note for work and asked if the prescription was ready. Pt's husband was informed that pt received a prescription 09/24/2016. Pt's husband states understanding.

## 2016-10-18 ENCOUNTER — Ambulatory Visit (INDEPENDENT_AMBULATORY_CARE_PROVIDER_SITE_OTHER): Payer: BLUE CROSS/BLUE SHIELD | Admitting: Sports Medicine

## 2016-10-18 ENCOUNTER — Ambulatory Visit (INDEPENDENT_AMBULATORY_CARE_PROVIDER_SITE_OTHER): Payer: BLUE CROSS/BLUE SHIELD

## 2016-10-18 DIAGNOSIS — M722 Plantar fascial fibromatosis: Secondary | ICD-10-CM

## 2016-10-18 DIAGNOSIS — M170 Bilateral primary osteoarthritis of knee: Secondary | ICD-10-CM

## 2016-10-18 NOTE — Assessment & Plan Note (Signed)
Has had multiple surgical procedures on the left foot including ganglion cyst excisions, first MTP fusion. Symptoms today are consistent with plantar fasciitis, it was initially thought that she had a fracture, but her pain is now resolved after a few weeks in a boot. I would like her to come back for custom orthotics, I'm adding rehabilitation exercises.

## 2016-10-18 NOTE — Progress Notes (Addendum)
Subjective:    I'm seeing this patient as a consultation for:  Max Stevphen Rochester, PA-C  CC: Right knee pain  HPI: This is a pleasant 56 year old female, for years now she's had right knee pain, I injected her several years ago after failure of conservative measures and she did fantastic, more recently she had some surgery on her left foot with a cyst removal and a first MTP fusion, she then developed pain on the plantar aspect of the heel, and was placed in a boot. Her knee pain has increased walking in the boot. Moderate, persistent without radiation.  Past medical history:  Negative.  See flowsheet/record as well for more information.  Surgical history: Negative.  See flowsheet/record as well for more information.  Family history: Negative.  See flowsheet/record as well for more information.  Social history: Negative.  See flowsheet/record as well for more information.  Allergies, and medications have been entered into the medical record, reviewed, and no changes needed.   Review of Systems: No headache, visual changes, nausea, vomiting, diarrhea, constipation, dizziness, abdominal pain, skin rash, fevers, chills, night sweats, weight loss, swollen lymph nodes, body aches, joint swelling, muscle aches, chest pain, shortness of breath, mood changes, visual or auditory hallucinations.   Objective:   General: Well Developed, well nourished, and in no acute distress.  Neuro/Psych: Alert and oriented x3, extra-ocular muscles intact, able to move all 4 extremities, sensation grossly intact. Skin: Warm and dry, no rashes noted.  Respiratory: Not using accessory muscles, speaking in full sentences, trachea midline.  Cardiovascular: Pulses palpable, no extremity edema. Abdomen: Does not appear distended. Right Knee: Normal to inspection with no erythema or effusion or obvious bony abnormalities. Tender to palpation at the medial joint line ROM normal in flexion and extension and lower leg  rotation. Ligaments with solid consistent endpoints including ACL, PCL, LCL, MCL. Negative Mcmurray's and provocative meniscal tests. Non painful patellar compression. Patellar and quadriceps tendons unremarkable. Hamstring and quadriceps strength is normal. leftFoot: No visible erythema or swelling. Range of motion is full in all directions. Strength is 5/5 in all directions. No hallux valgus. No pes cavus or pes planus. No abnormal callus noted. No pain over the navicular prominence, or base of fifth metatarsal. Only minimal tenderness to palpation at the calcaneal origin of the plantar fascia No pain at the Achilles insertion. No pain over the calcaneal bursa. No pain of the retrocalcaneal bursa. No tenderness to palpation over the tarsals, metatarsals, or phalanges. No hallux rigidus or limitus. No tenderness palpation over interphalangeal joints. No pain with compression of the metatarsal heads. Neurovascularly intact distally.  Procedure: Real-time Ultrasound Guided Injection of right knee Device: GE Logiq E  Verbal informed consent obtained.  Time-out conducted.  Noted no overlying erythema, induration, or other signs of local infection.  Skin prepped in a sterile fashion.  Local anesthesia: Topical Ethyl chloride.  With sterile technique and under real time ultrasound guidance:  1 mL Kenalog 40, 2 mL lidocaine, 2 mL bupivacaine injected easily. Completed without difficulty  Pain immediately resolved suggesting accurate placement of the medication.  Advised to call if fevers/chills, erythema, induration, drainage, or persistent bleeding.  Images permanently stored and available for review in the ultrasound unit.  Impression: Technically successful ultrasound guided injection.  Impression and Recommendations:   This case required medical decision making of moderate complexity.  Primary osteoarthritis of both knees Previous injection into the right knee was several  years ago, having recurrence of pain but has been in  a boot on the left side. Unfortunately meloxicam is not helping so we are going to proceed with injection today. Return in a month for this.  Plantar fasciitis, left Has had multiple surgical procedures on the left foot including ganglion cyst excisions, first MTP fusion. Symptoms today are consistent with plantar fasciitis, it was initially thought that she had a fracture, but her pain is now resolved after a few weeks in a boot. I would like her to come back for custom orthotics, I'm adding rehabilitation exercises.

## 2016-10-18 NOTE — Assessment & Plan Note (Signed)
Previous injection into the right knee was several years ago, having recurrence of pain but has been in a boot on the left side. Unfortunately meloxicam is not helping so we are going to proceed with injection today. Return in a month for this.

## 2016-10-21 ENCOUNTER — Ambulatory Visit: Payer: Self-pay | Admitting: Sports Medicine

## 2016-10-22 ENCOUNTER — Ambulatory Visit (INDEPENDENT_AMBULATORY_CARE_PROVIDER_SITE_OTHER): Payer: BLUE CROSS/BLUE SHIELD | Admitting: Podiatry

## 2016-10-22 DIAGNOSIS — S92002A Unspecified fracture of left calcaneus, initial encounter for closed fracture: Secondary | ICD-10-CM

## 2016-10-22 DIAGNOSIS — M7660 Achilles tendinitis, unspecified leg: Secondary | ICD-10-CM | POA: Diagnosis not present

## 2016-10-22 NOTE — Progress Notes (Signed)
She presents today for follow-up of her fracture avulsion of her heel spur left Achilles. She states that is doing about 50% improved. She states that she is no longer wearing her tall Cam Walker because it is causing knee pain and hip pain.  Objective: Vital signs are stable she's alert 3. Pulses are palpable. She has much decrease in edema and erythema and warmth to the posterior aspect of the calcaneus. Not a lot of palpable symptoms. No reproducible pain.  Assessment: Insertional Achilles tendinitis cannot rule out an Achilles tear with an avulsion of a heel spur left heel.  Plan: At this point I request that she continue to wear the Cam Walker but we are going give her a smaller unit and I'll follow-up with her in 1 month

## 2016-10-24 ENCOUNTER — Encounter: Payer: Self-pay | Admitting: Podiatry

## 2016-10-24 ENCOUNTER — Encounter: Payer: Self-pay | Admitting: Sports Medicine

## 2016-10-25 ENCOUNTER — Encounter: Payer: Self-pay | Admitting: Sports Medicine

## 2016-10-25 ENCOUNTER — Ambulatory Visit (INDEPENDENT_AMBULATORY_CARE_PROVIDER_SITE_OTHER): Payer: BLUE CROSS/BLUE SHIELD | Admitting: Sports Medicine

## 2016-10-25 DIAGNOSIS — M722 Plantar fascial fibromatosis: Secondary | ICD-10-CM

## 2016-10-25 NOTE — Assessment & Plan Note (Signed)
Post multiple surgeries on the left foot including ganglion cyst excisions, first MTP fusion. Symptoms have improved significantly since coming out of the boot, mostly plantar fasciitis clinically. Custom orthotics as above, rehabilitation exercises should be continued, return in one month.

## 2016-10-25 NOTE — Progress Notes (Signed)

## 2016-11-19 ENCOUNTER — Encounter (INDEPENDENT_AMBULATORY_CARE_PROVIDER_SITE_OTHER): Payer: BLUE CROSS/BLUE SHIELD | Admitting: Podiatry

## 2016-11-19 NOTE — Progress Notes (Signed)
This encounter was created in error - please disregard.

## 2016-11-25 ENCOUNTER — Ambulatory Visit (INDEPENDENT_AMBULATORY_CARE_PROVIDER_SITE_OTHER): Payer: BLUE CROSS/BLUE SHIELD | Admitting: Sports Medicine

## 2016-11-25 DIAGNOSIS — M722 Plantar fascial fibromatosis: Secondary | ICD-10-CM | POA: Diagnosis not present

## 2016-11-25 MED ORDER — TRAMADOL HCL 50 MG PO TABS: ORAL_TABLET | ORAL | 0 refills | Status: DC

## 2016-11-25 NOTE — Progress Notes (Signed)
  Subjective:    CC: Follow-up  HPI: This is a pleasant 56 year old female, we have been treating her for left foot pain, clinically resembling plantar fasciitis of the last visit. We made her orthotics, she's been taking meloxicam, and doing rehabilitation exercises. She no longer has pain on the plantar aspect of the heel, but does endorse some pain over the posterior heel. Overall she's only 50% better.  Past medical history:  Negative.  See flowsheet/record as well for more information.  Surgical history: Negative.  See flowsheet/record as well for more information.  Family history: Negative.  See flowsheet/record as well for more information.  Social history: Negative.  See flowsheet/record as well for more information.  Allergies, and medications have been entered into the medical record, reviewed, and no changes needed.   Review of Systems: No fevers, chills, night sweats, weight loss, chest pain, or shortness of breath.   Objective:    General: Well Developed, well nourished, and in no acute distress.  Neuro: Alert and oriented x3, extra-ocular muscles intact, sensation grossly intact.  HEENT: Normocephalic, atraumatic, pupils equal round reactive to light, neck supple, no masses, no lymphadenopathy, thyroid nonpalpable.  Skin: Warm and dry, no rashes. Cardiac: Regular rate and rhythm, no murmurs rubs or gallops, no lower extremity edema.  Respiratory: Clear to auscultation bilaterally. Not using accessory muscles, speaking in full sentences. Left Ankle: No visible erythema or swelling. Range of motion is full in all directions. Strength is 5/5 in all directions. Stable lateral and medial ligaments; squeeze test and kleiger test unremarkable; Talar dome nontender; No pain at base of 5th MT; No tenderness over cuboid; No tenderness over N spot or navicular prominence No tenderness on posterior aspects of lateral and medial malleolus No sign of peroneal tendon  subluxations; Negative tarsal tunnel tinel's Able to walk 4 steps.  Minimal pain over the retrocalcaneal bursa, but also the tibialis posterior. I am really unable to reproducibly elicit her pain on exam.  Impression and Recommendations:    Plantar fasciitis, left As post multiple surgeries including ganglion cyst excision and first MTP fusion. 50% better with custom orthotics and plantar fasciitis rehabilitation exercises. She no longer has pain at the plantar fascia but more the posterior heel. Some of her symptoms are consistent with retrocalcaneal bursitis, but I'm unable to reliably reproduce her symptoms, there was some pain with firing her tibialis posterior as well. At this point she has failed greater than 6 weeks of physician directed conservative measures without a definitive etiology noted on x-rays. For this reason I going to proceed with an MRI. Continue orthotics, return to go over MRI results.  I spent 25 minutes with this patient, greater than 50% was face-to-face time counseling regarding the above diagnoses

## 2016-11-25 NOTE — Assessment & Plan Note (Signed)
As post multiple surgeries including ganglion cyst excision and first MTP fusion. 50% better with custom orthotics and plantar fasciitis rehabilitation exercises. She no longer has pain at the plantar fascia but more the posterior heel. Some of her symptoms are consistent with retrocalcaneal bursitis, but I'm unable to reliably reproduce her symptoms, there was some pain with firing her tibialis posterior as well. At this point she has failed greater than 6 weeks of physician directed conservative measures without a definitive etiology noted on x-rays. For this reason I going to proceed with an MRI. Continue orthotics, return to go over MRI results.

## 2016-11-28 ENCOUNTER — Telehealth: Payer: Self-pay

## 2016-11-28 NOTE — Telephone Encounter (Signed)
Initiated pre-cert for MR ANKLE LEFT WO CONTRAST and MR HEEL LEFT WO CONTRAST. Ankle Approved from 11/26/16-12/25/16 reference number: [id number].  Heel requires further review as it does not meet medical necessity. Peer to Peer can be initiated by callong [phone].

## 2016-11-28 NOTE — Telephone Encounter (Signed)
I have gotten approval for the foot/heel MRI, approval #[id number] approval dates August 9 through September 7.  She should now be able to be scheduled for her MRI of the ankle and her MRI of the foot/heel.

## 2016-11-29 NOTE — Telephone Encounter (Signed)
Information given to cindy to be entered into epic.

## 2016-12-09 ENCOUNTER — Other Ambulatory Visit: Payer: Self-pay

## 2016-12-16 ENCOUNTER — Ambulatory Visit (INDEPENDENT_AMBULATORY_CARE_PROVIDER_SITE_OTHER): Payer: BLUE CROSS/BLUE SHIELD

## 2016-12-16 ENCOUNTER — Telehealth: Payer: Self-pay

## 2016-12-16 ENCOUNTER — Ambulatory Visit: Payer: BLUE CROSS/BLUE SHIELD

## 2016-12-16 DIAGNOSIS — M67472 Ganglion, left ankle and foot: Secondary | ICD-10-CM | POA: Diagnosis not present

## 2016-12-16 DIAGNOSIS — M722 Plantar fascial fibromatosis: Secondary | ICD-10-CM

## 2016-12-16 DIAGNOSIS — M7752 Other enthesopathy of left foot: Secondary | ICD-10-CM

## 2016-12-16 MED ORDER — TRAMADOL HCL 50 MG PO TABS: ORAL_TABLET | ORAL | 0 refills | Status: DC

## 2016-12-16 NOTE — Telephone Encounter (Signed)
Done.  Rx in box.

## 2016-12-16 NOTE — Telephone Encounter (Signed)
Pt would like a refill of Tramadol. Please advise.

## 2016-12-17 NOTE — Telephone Encounter (Signed)
Pt.notified

## 2016-12-20 ENCOUNTER — Ambulatory Visit (INDEPENDENT_AMBULATORY_CARE_PROVIDER_SITE_OTHER): Payer: BLUE CROSS/BLUE SHIELD | Admitting: Sports Medicine

## 2016-12-20 ENCOUNTER — Encounter: Payer: Self-pay | Admitting: Sports Medicine

## 2016-12-20 DIAGNOSIS — M7662 Achilles tendinitis, left leg: Secondary | ICD-10-CM | POA: Diagnosis not present

## 2016-12-20 MED ORDER — PHENTERMINE HCL 37.5 MG PO TABS: ORAL_TABLET | ORAL | 0 refills | Status: DC

## 2016-12-20 MED ORDER — NITROGLYCERIN 0.2 MG/HR TD PT24: MEDICATED_PATCH | TRANSDERMAL | 11 refills | Status: DC

## 2016-12-20 NOTE — Progress Notes (Signed)
  Subjective:    CC: Follow-up MRI  HPI: This is a pleasant 56 year old female, she's had extremely extensive foot surgery, more recently she's been immobilized for a period of time, no improvements in pain, mostly with regard to her posterior heel pain. We obtained an MRI the results of which will be dictated below. She feels as though her workout with her trainer exacerbates her pain, unfortunately she is working really hard to lose some weight and feels as though it's creeping back up as she has not been able to exercise as frequently. She does have a anniversary trip coming up as well.  Past medical history:  Negative.  See flowsheet/record as well for more information.  Surgical history: Negative.  See flowsheet/record as well for more information.  Family history: Negative.  See flowsheet/record as well for more information.  Social history: Negative.  See flowsheet/record as well for more information.  Allergies, and medications have been entered into the medical record, reviewed, and no changes needed.   Review of Systems: No fevers, chills, night sweats, weight loss, chest pain, or shortness of breath.   Objective:    General: Well Developed, well nourished, and in no acute distress.  Neuro: Alert and oriented x3, extra-ocular muscles intact, sensation grossly intact.  HEENT: Normocephalic, atraumatic, pupils equal round reactive to light, neck supple, no masses, no lymphadenopathy, thyroid nonpalpable.  Skin: Warm and dry, no rashes. Cardiac: Regular rate and rhythm, no murmurs rubs or gallops, no lower extremity edema.  Respiratory: Clear to auscultation bilaterally. Not using accessory muscles, speaking in full sentences. Left Ankle: No visible erythema or swelling. Range of motion is full in all directions. Strength is 5/5 in all directions. Stable lateral and medial ligaments; squeeze test and kleiger test unremarkable; Talar dome nontender; No pain at base of 5th MT; No  tenderness over cuboid; No tenderness over N spot or navicular prominence No tenderness on posterior aspects of lateral and medial malleolus No sign of peroneal tendon subluxations; Negative tarsal tunnel tinel's Tender to palpation at the Achilles insertion.  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  Impression and Recommendations:    Left Achilles tendinitis MRI shows a posterior tibiotalar cyst, I don't think this is symptomatic. She also has significant intra-Achilles calcification with reactive edema in the calcaneus. Adding bilateral heel lifts, topical nitroglycerin patches and aggressive eccentric physical therapy for left Achilles tendinosis. I'm also going to give her some phentermine to take for the next couple of months while she can't exercise. If this fails after 6-8 weeks we will consider percutaneous tenotomy with platelet rich plasma. Course this would necessitate boot cast immobilization for a week or 2 after the procedure.  I spent 25 minutes with this patient, greater than 50% was face-to-face time counseling regarding the above diagnoses

## 2016-12-20 NOTE — Assessment & Plan Note (Addendum)
MRI shows a posterior tibiotalar cyst, I don't think this is symptomatic. She also has significant intra-Achilles calcification with reactive edema in the calcaneus. Adding bilateral heel lifts, topical nitroglycerin patches and aggressive eccentric physical therapy for left Achilles tendinosis. I'm also going to give her some phentermine to take for the next couple of months while she can't exercise. If this fails after 6-8 weeks we will consider percutaneous tenotomy with platelet rich plasma. Course this would necessitate boot cast immobilization for a week or 2 after the procedure.

## 2017-02-19 ENCOUNTER — Other Ambulatory Visit: Payer: Self-pay | Admitting: Sports Medicine

## 2017-02-19 DIAGNOSIS — M722 Plantar fascial fibromatosis: Secondary | ICD-10-CM

## 2017-05-23 ENCOUNTER — Encounter: Payer: Self-pay | Admitting: Sports Medicine

## 2017-05-23 ENCOUNTER — Ambulatory Visit (INDEPENDENT_AMBULATORY_CARE_PROVIDER_SITE_OTHER): Payer: BLUE CROSS/BLUE SHIELD | Admitting: Sports Medicine

## 2017-05-23 DIAGNOSIS — M7662 Achilles tendinitis, left leg: Secondary | ICD-10-CM | POA: Diagnosis not present

## 2017-05-23 MED ORDER — AMBULATORY NON FORMULARY MEDICATION: 0 refills | Status: AC

## 2017-05-23 NOTE — Progress Notes (Signed)
Subjective:    CC: Ankle pain  HPI: Deanna Caldwell returns, she is a pleasant 57 year old female with left Achilles tendinitis, I treated her back in August of last year, initially she did well with topical nitroglycerin patches and rehabilitation.  Now is having a flare in pain, localized at the left heel over the distal Achilles as well as the calcaneus.  We did an MRI that showed calcifications in the distal Achilles, its insertion, as well as reactive edema in the calcaneus.  Symptoms are moderate, worsening.  I reviewed the past medical history, family history, social history, surgical history, and allergies today and no changes were needed.  Please see the problem list section below in epic for further details.  Past Medical History: Past Medical History:  Diagnosis Date  . Anxiety   . BRCA negative   . Depression   . Diabetes mellitus, type II ([place])    12/31/2011--diet controlled  . Endometriosis   . Fibroid   . Hypertension 2007  . Sleep apnea 2014  . TIA (transient ischemic attack) 10/04/2011  . Urinary incontinence   . UTI (urinary tract infection)    Past Surgical History: Past Surgical History:  Procedure Laterality Date  . ABDOMINAL HYSTERECTOMY  2005   fibroids/endometriosis  . BLADDER SUSPENSION N/A 04/04/2015   Procedure: TRANSVAGINAL TAPE (TVT) PROCEDURE;  Surgeon: Nunzio Cobbs, MD;  Location: [facility];  Service: Gynecology;  Laterality: N/A;  1.25 hours  . CYSTOSCOPY N/A 04/04/2015   Procedure: CYSTOSCOPY;  Surgeon: Nunzio Cobbs, MD;  Location: [facility];  Service: Gynecology;  Laterality: N/A;  . foot surgery  2010   Removed cartilage from both feet  . OOPHORECTOMY  2007   BSO--due to cysts   Social History: Social History   Socioeconomic History  . Marital status: Married    Spouse name: None  . Number of children: None  . Years of education: None  . Highest education level: None  Social Needs  . Financial resource strain: None  . Food  insecurity - worry: None  . Food insecurity - inability: None  . Transportation needs - medical: None  . Transportation needs - non-medical: None  Occupational History  . None  Tobacco Use  . Smoking status: Never Smoker  . Smokeless tobacco: Never Used  Substance and Sexual Activity  . Alcohol use: Yes    Alcohol/week: 0.0 oz    Comment: occasionally-1 drink every 2 months  . Drug use: No  . Sexual activity: Yes    Partners: Male    Birth control/protection: Surgical    Comment: TAH 2005/BSO 2007  Other Topics Concern  . None  Social History Narrative  . None   Family History: Family History  Problem Relation Age of Onset  . Heart attack Mother   . Hypertension Mother   . Cancer Mother   . Stroke Mother   . Breast cancer Mother        dec age 30  . Stroke Father   . Stroke Brother   . Diabetes Brother   . Hypertension Brother   . Diabetes type II Sister 16  . Breast cancer Sister   . Hypertension Sister   . Stroke Brother   . Hypertension Brother   . Breast cancer Sister 66  . Diabetes Sister   . Breast cancer Sister 31       metastatic  . Hypertension Sister   . Diabetes type II Paternal Aunt   . Diabetes  type II Paternal Uncle   . Miscarriages / Stillbirths Daughter    Allergies: No Known Allergies Medications: See med rec.  Review of Systems: No fevers, chills, night sweats, weight loss, chest pain, or shortness of breath.   Objective:    General: Well Developed, well nourished, and in no acute distress.  Neuro: Alert and oriented x3, extra-ocular muscles intact, sensation grossly intact.  HEENT: Normocephalic, atraumatic, pupils equal round reactive to light, neck supple, no masses, no lymphadenopathy, thyroid nonpalpable.  Skin: Warm and dry, no rashes. Cardiac: Regular rate and rhythm, no murmurs rubs or gallops, no lower extremity edema.  Respiratory: Clear to auscultation bilaterally. Not using accessory muscles, speaking in full  sentences. Left ankle: No visible erythema or swelling. Range of motion is full in all directions. Strength is 5/5 in all directions. Stable lateral and medial ligaments; squeeze test and kleiger test unremarkable; Talar dome nontender; No pain at base of 5th MT; No tenderness over cuboid; No tenderness over N spot or navicular prominence No tenderness on posterior aspects of lateral and medial malleolus No sign of peroneal tendon subluxations; Negative tarsal tunnel tinel's Tender to palpation over the distal Achilles as well as the posterior aspect of the calcaneus. No tenderness specifically over the calcaneal or the retrocalcaneal bursae  Impression and Recommendations:    Left Achilles tendinitis I treated her approximately 6 months ago for this. Initially had a good response to topical nitroglycerin. She did have an MRI that showed a posterior tibiotalar cyst that was asymptomatic, she did have significant intra-Achilles calcification with reactive edema in the calcaneus. Her pain today is mostly over the Achilles, as well as the calcaneus. For now we are going to increase to one half tab nitroglycerin daily, and shut her down in a boot for a month. If persistent pain after 1 month we will proceed with a percutaneous tendon fenestration with platelet rich plasma. I would then of course have shut her down for another month in a boot. I am also going to give her a prescription for a rolling knee scooter to use in the meantime, continue heel lifts.  ___________________________________________ Gwen Her. Dianah Field, M.D., ABFM., CAQSM. Primary Care and [place] In[employer] of [employer] [facility]

## 2017-05-23 NOTE — Assessment & Plan Note (Signed)
I treated her approximately 6 months ago for this. Initially had a good response to topical nitroglycerin. She did have an MRI that showed a posterior tibiotalar cyst that was asymptomatic, she did have significant intra-Achilles calcification with reactive edema in the calcaneus. Her pain today is mostly over the Achilles, as well as the calcaneus. For now we are going to increase to one half tab nitroglycerin daily, and shut her down in a boot for a month. If persistent pain after 1 month we will proceed with a percutaneous tendon fenestration with platelet rich plasma. I would then of course have shut her down for another month in a boot. I am also going to give her a prescription for a rolling knee scooter to use in the meantime, continue heel lifts.

## 2017-06-30 DIAGNOSIS — M9262 Juvenile osteochondrosis of tarsus, left ankle: Secondary | ICD-10-CM | POA: Insufficient documentation

## 2017-06-30 DIAGNOSIS — Z969 Presence of functional implant, unspecified: Secondary | ICD-10-CM | POA: Insufficient documentation

## 2017-12-11 IMAGING — MR MR ANKLE*L* W/O CM
5 series · 40 of 40 positions shown · non-contrast
Comparison: 10/18/2016 left foot radiographs

CLINICAL DATA: Posterior ankle pain question posterior ankle
impingement versus retrocalcaneal bursitis versus tibialis posterior
tendinitis.

EXAM:
MRI OF THE LEFT ANKLE WITHOUT CONTRAST
TECHNIQUE: Multiplanar, multisequence MR imaging of the ankle was performed. No
intravenous contrast was administered.

[Series 3: PD fat-sat · axial · 3.0mm · 0.70mm/px · z∈[-105,+56]mm · 10 of 50 slices shown]
[im 1/50]
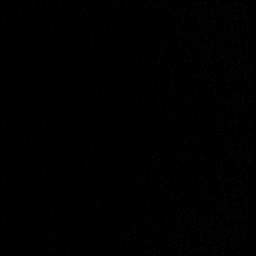
[im 6/50]
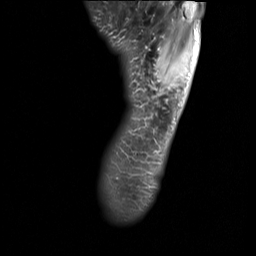
[im 11/50]
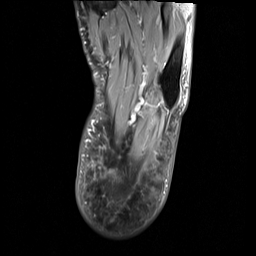
[im 17/50]
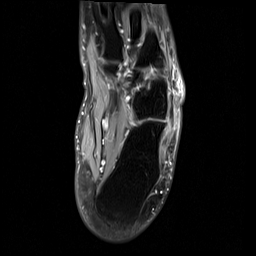
[im 22/50]
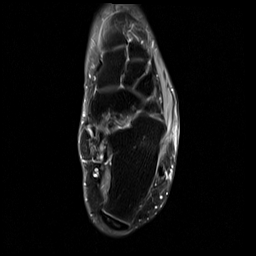
[im 28/50]
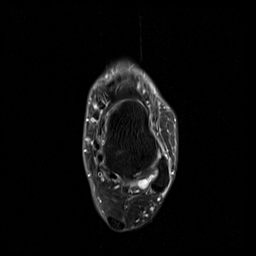
[im 33/50]
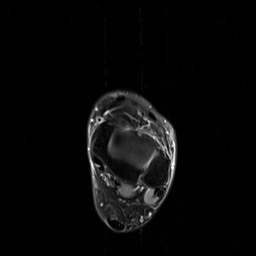
[im 39/50]
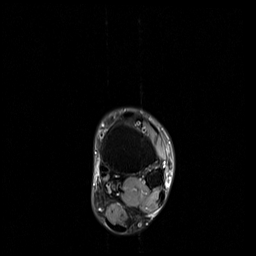
[im 44/50]
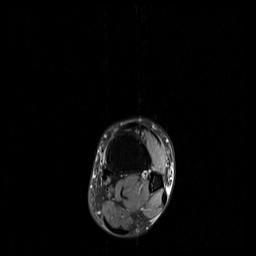
[im 50/50]
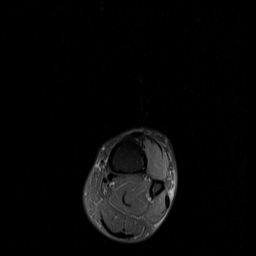

[Series 4: T2 fat-sat · axial · 3.0mm · 0.70mm/px · z∈[-105,+56]mm · 9 of 50 slices shown (1 of 3)]
[im 1/50]
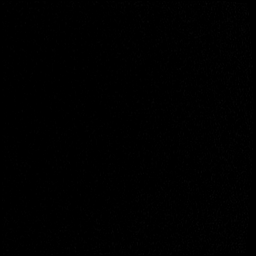
[im 7/50]
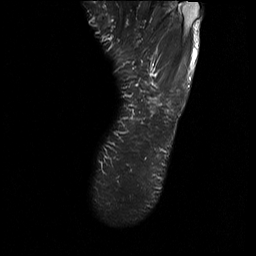
[im 13/50]
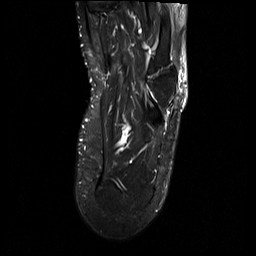
[im 19/50]
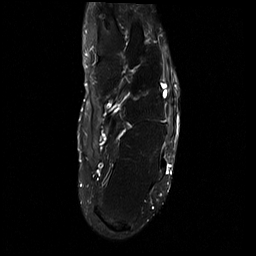
[im 25/50]
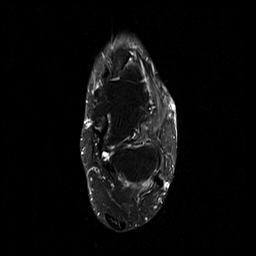
[im 31/50]
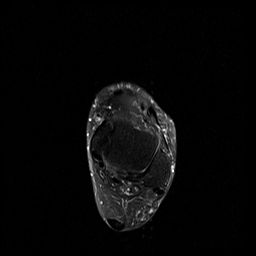
[im 37/50]
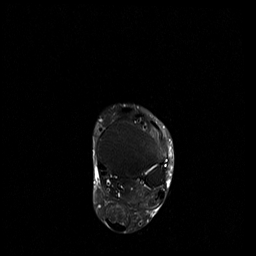
[im 43/50]
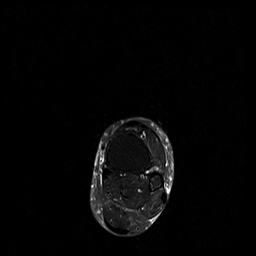
[im 50/50]
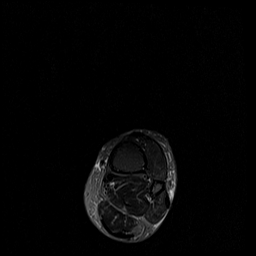

[Series 5: T2 fat-sat · coronal · 3.0mm · 0.74mm/px · 10 of 54 slices shown (2 of 3)]
[im 1/54]
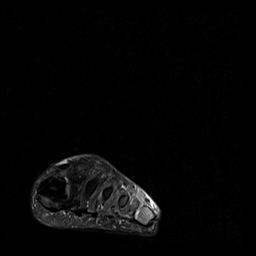
[im 6/54]
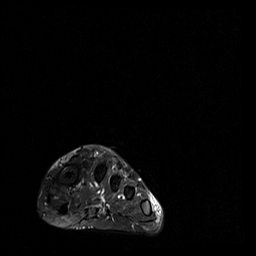
[im 12/54]
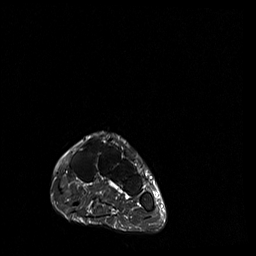
[im 18/54]
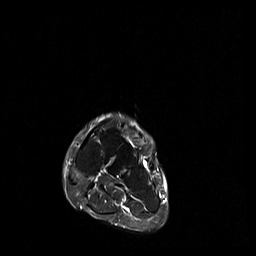
[im 24/54]
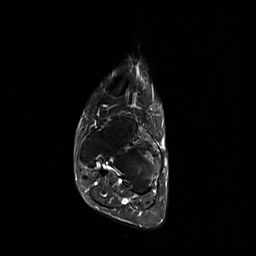
[im 30/54]
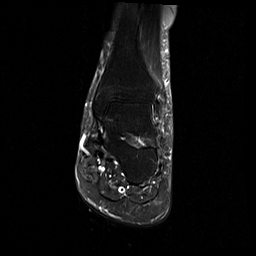
[im 36/54]
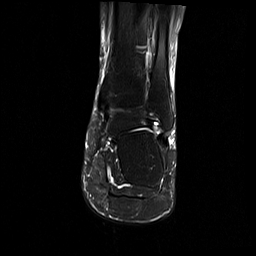
[im 42/54]
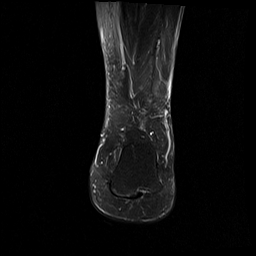
[im 48/54]
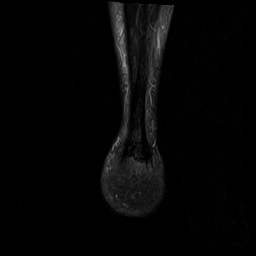
[im 54/54]
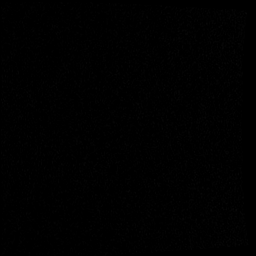

[Series 6: T1 · sagittal · 3.0mm · 0.59mm/px · 5 of 29 slices shown]
[im 1/29]
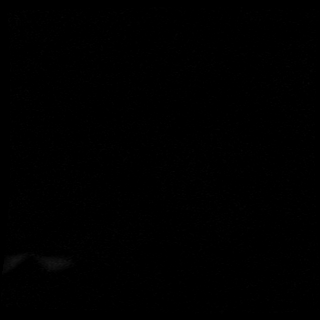
[im 8/29]
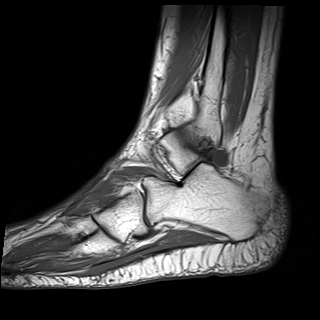
[im 15/29]
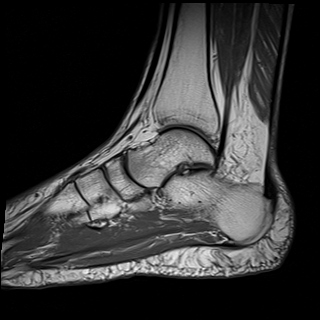
[im 22/29]
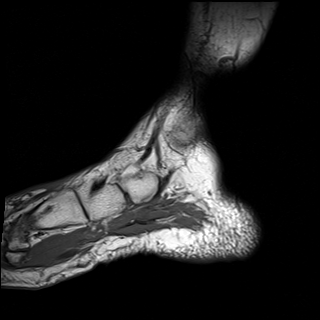
[im 29/29]
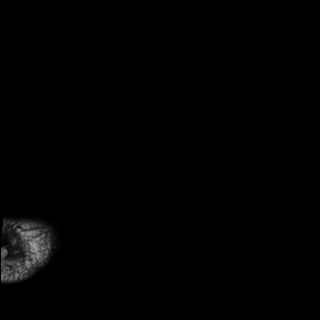

[Series 7: T2 fat-sat · sagittal · 3.0mm · 0.74mm/px · 6 of 30 slices shown (3 of 3)]
[im 1/30]
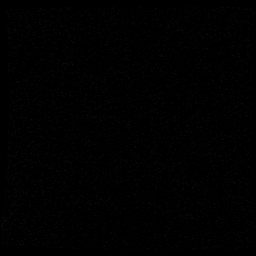
[im 6/30]
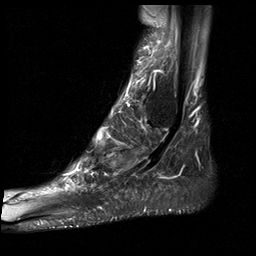
[im 12/30]
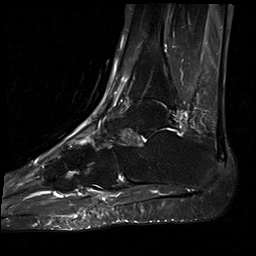
[im 18/30]
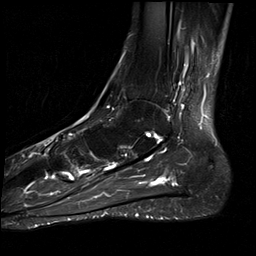
[im 24/30]
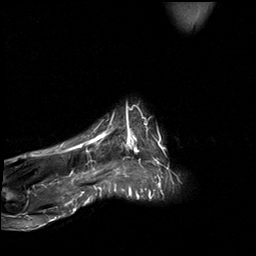
[im 30/30]
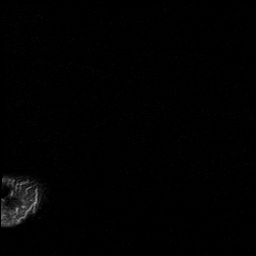

[40 of 40 positions shown; findings below may reference images not displayed]

FINDINGS: TENDONS

Peroneal: Intact peroneus longus and peroneus brevis tendons.

Posteromedial: Intact tibialis posterior, flexor hallucis longus and
flexor digitorum longus tendons.

Anterior: Intact tibialis anterior, extensor hallucis longus and
extensor digitorum longus tendons.

Achilles: Thickening of the distal Achilles with small ossifications
seen within the distal Achilles tendon and mild reactive marrow
edema at its insertion on the calcaneus. Trace retrocalcaneal
bursitis.

Plantar Fascia: Intact.

LIGAMENTS

Lateral: Intact.

Medial: Intact.

CARTILAGE

Ankle Joint: No joint effusion or chondral defect.

Subtalar Joints/Sinus Tarsi: Small ganglion cyst measuring 11 x 8 x
9 mm is seen posterior to the subtalar joint The sinus tarsi is
unremarkable.

Bones: No marrow signal abnormality. No fracture or dislocation.

Other: None
IMPRESSION: 1. Small ossifications are seen within the distal Achilles tendon
with thickening consistent with tendinopathy. There is mild reactive
marrow edema of the calcaneus at the Achilles insertion. Trace
retrocalcaneal bursitis is noted.
2. Small ganglion cyst is seen posterior to the subtalar joint
measuring 11 x 8 x 9 mm.

## 2018-01-14 DIAGNOSIS — Z8489 Family history of other specified conditions: Secondary | ICD-10-CM | POA: Insufficient documentation

## 2018-01-15 ENCOUNTER — Other Ambulatory Visit: Payer: Self-pay | Admitting: Obstetrics and Gynecology

## 2018-01-15 DIAGNOSIS — N644 Mastodynia: Secondary | ICD-10-CM

## 2018-01-19 ENCOUNTER — Ambulatory Visit
Admission: RE | Admit: 2018-01-19 | Discharge: 2018-01-19 | Disposition: A | Discharge status: Home / Self Care | Payer: BLUE CROSS/BLUE SHIELD | Source: Ambulatory Visit | Attending: Obstetrics and Gynecology | Admitting: Obstetrics and Gynecology

## 2018-01-19 DIAGNOSIS — N644 Mastodynia: Secondary | ICD-10-CM

## 2019-01-20 DIAGNOSIS — G5621 Lesion of ulnar nerve, right upper limb: Secondary | ICD-10-CM | POA: Insufficient documentation

## 2019-03-10 DIAGNOSIS — M67432 Ganglion, left wrist: Secondary | ICD-10-CM | POA: Insufficient documentation

## 2020-11-24 DIAGNOSIS — E210 Primary hyperparathyroidism: Secondary | ICD-10-CM | POA: Insufficient documentation

## 2021-09-27 ENCOUNTER — Encounter: Payer: Self-pay | Admitting: Podiatry

## 2021-09-27 ENCOUNTER — Ambulatory Visit (INDEPENDENT_AMBULATORY_CARE_PROVIDER_SITE_OTHER): Payer: BC Managed Care – PPO

## 2021-09-27 ENCOUNTER — Ambulatory Visit: Payer: BC Managed Care – PPO | Admitting: Podiatry

## 2021-09-27 DIAGNOSIS — R6882 Decreased libido: Secondary | ICD-10-CM | POA: Insufficient documentation

## 2021-09-27 DIAGNOSIS — M2042 Other hammer toe(s) (acquired), left foot: Secondary | ICD-10-CM | POA: Diagnosis not present

## 2021-09-27 DIAGNOSIS — N644 Mastodynia: Secondary | ICD-10-CM | POA: Insufficient documentation

## 2021-09-27 DIAGNOSIS — N393 Stress incontinence (female) (male): Secondary | ICD-10-CM | POA: Insufficient documentation

## 2021-09-27 DIAGNOSIS — N760 Acute vaginitis: Secondary | ICD-10-CM | POA: Insufficient documentation

## 2021-09-27 DIAGNOSIS — G4700 Insomnia, unspecified: Secondary | ICD-10-CM | POA: Insufficient documentation

## 2021-09-27 DIAGNOSIS — Z780 Asymptomatic menopausal state: Secondary | ICD-10-CM | POA: Insufficient documentation

## 2021-09-27 NOTE — Progress Notes (Signed)
Subjective:  Patient ID: MARAGRET VANACKER, female    DOB: 1961/04/08,  MRN: [id number] HPI Chief Complaint  Patient presents with   Toe Pain    2nd toe left - tip of toe curling under, with heeled shoes pain radiates to dorsal foot   New Patient (Initial Visit)    Est pt 2018   Diabetes    LAst a1c was 5.5    61 y.o. female presents with the above complaint.   ROS: Denies fever chills nausea vomiting muscle aches pains calf pain back pain chest pain shortness of breath.  Past Medical History:  Diagnosis Date   Anxiety    BRCA negative    Depression    Diabetes mellitus, type II ([place])    12/31/2011--diet controlled   Endometriosis    Fibroid    Hypertension 2007   Sleep apnea 2014   TIA (transient ischemic attack) 10/04/2011   Urinary incontinence    UTI (urinary tract infection)    Past Surgical History:  Procedure Laterality Date   ABDOMINAL HYSTERECTOMY  2005   fibroids/endometriosis   BLADDER SUSPENSION N/A 04/04/2015   Procedure: TRANSVAGINAL TAPE (TVT) PROCEDURE;  Surgeon: Nunzio Cobbs, MD;  Location: [facility];  Service: Gynecology;  Laterality: N/A;  1.25 hours   CYSTOSCOPY N/A 04/04/2015   Procedure: CYSTOSCOPY;  Surgeon: Nunzio Cobbs, MD;  Location: [facility];  Service: Gynecology;  Laterality: N/A;   foot surgery  2010   Removed cartilage from both feet   OOPHORECTOMY  2007   BSO--due to cysts    Current Outpatient Medications:    Dulaglutide (TRULICITY [place]), Inject into the skin., Disp: , Rfl:    LOSARTAN POTASSIUM-HCTZ PO, Take by mouth., Disp: , Rfl:    Multiple Vitamin (MULTIVITAMIN) capsule, Take 1 capsule by mouth daily., Disp: , Rfl:    AMBULATORY NON FORMULARY MEDICATION, Rolling Knee Scooter, please take Rx to medical supply store., Disp: 1 each, Rfl: 0   nitroGLYCERIN (NITRODUR - DOSED IN MG/24 HR) 0.2 mg/hr patch, Cut and apply 1/2 patch to most painful area q24h., Disp: 30 patch, Rfl: 11   zolpidem (AMBIEN) 10 MG tablet, Take  10 mg by mouth at bedtime as needed for sleep. , Disp: , Rfl:   No Known Allergies Review of Systems Objective:  There were no vitals filed for this visit.  General: Well developed, nourished, in no acute distress, alert and oriented x3   Dermatological: Skin is warm, dry and supple bilateral. Nails x 10 are well maintained; remaining integument appears unremarkable at this time. There are no open sores, no preulcerative lesions, no rash or signs of infection present.  Vascular: Dorsalis Pedis artery and Posterior Tibial artery pedal pulses are 2/4 bilateral with immedate capillary fill time. Pedal hair growth present. No varicosities and no lower extremity edema present bilateral.   Neruologic: Grossly intact via light touch bilateral. Vibratory intact via tuning fork bilateral. Protective threshold with Semmes Wienstein monofilament intact to all pedal sites bilateral. Patellar and Achilles deep tendon reflexes 2+ bilateral. No Babinski or clonus noted bilateral.   Musculoskeletal: No gross boney pedal deformities bilateral. No pain, crepitus, or limitation noted with foot and ankle range of motion bilateral. Muscular strength 5/5 in all groups tested bilateral.  Pain on palpation DIPJ second digit left foot.  Gait: Unassisted, Nonantalgic.    Radiographs:  Radiographs taken today demonstrate osseously mature individual with a Keller arthroplasty single silicone implant with grommets that is intact left  foot.  She also has a mallet toe deformity with osteoarthritic changes and spurring at the DIPJ second digit left foot.  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  At this point we discussed in great detail a DIPJ arthroplasty second digit left foot.  I answered all questions regarding this procedure to the best my billing layman's terms she understood and  was amenable to it signed all 3 pages of the consent form.  We did discuss possible postop complications which may include but are not limited to postop pain bleeding swelling infection recurrence need for further surgery overcorrection under correction loss of digit loss of limb loss of life.  We discussed the surgery center and the anesthesia group and I will follow-up with her in the near future.  She has a Darco shoe at home.     Stefhanie Kachmar T. Johnson, [place]

## 2021-10-04 ENCOUNTER — Telehealth: Payer: Self-pay | Admitting: Urology

## 2021-10-04 NOTE — Telephone Encounter (Signed)
DOS - 10/26/21  [facility] REPAIR 2ND LEFT --- [id number]  [employer] EFFECTIVE DATE - 04/22/21  PLAN DEDUCTIBLE - $1,000.00 W/ $837.00 REMAINING OUT OF POCKET - $2,500.00 W/ $1,952.00 REMAINING COINSURANCE - 20% COPAY - $0.00  SPOKE [employer]EFITS MANAGEMENT AND SHE STATED THAT FOR CPT CODE 89169 HAS BEEN APPROVED, AUTH # [id number], GOOD FROM 10/26/21 - 12/24/21.  REF # Phineas Real 10/04/21 AT 10:30 AM CT

## 2021-10-24 ENCOUNTER — Other Ambulatory Visit: Payer: Self-pay | Admitting: Podiatry

## 2021-10-24 ENCOUNTER — Telehealth: Payer: Self-pay

## 2021-10-24 MED ORDER — ONDANSETRON HCL 4 MG PO TABS: 4.0000 mg | ORAL_TABLET | Freq: Three times a day (TID) | ORAL | 0 refills | Status: DC | PRN

## 2021-10-24 MED ORDER — CEPHALEXIN 500 MG PO CAPS: 500.0000 mg | ORAL_CAPSULE | Freq: Three times a day (TID) | ORAL | 0 refills | Status: DC

## 2021-10-24 MED ORDER — OXYCODONE-ACETAMINOPHEN 10-325 MG PO TABS: 1.0000 | ORAL_TABLET | Freq: Three times a day (TID) | ORAL | 0 refills | Status: AC | PRN

## 2021-10-24 NOTE — Telephone Encounter (Signed)
Deanna Caldwell called to reschedule her surgery with Dr. Milinda Pointer on 10/26/2021. She stated she owes money to [facility] and needs a couple weeks to pay it. I have her rescheduled to 11/09/2021.

## 2021-11-01 ENCOUNTER — Encounter: Payer: BC Managed Care – PPO | Admitting: Podiatry

## 2021-11-06 ENCOUNTER — Telehealth: Payer: Self-pay | Admitting: Urology

## 2021-11-06 NOTE — Telephone Encounter (Signed)
Spoke with pt she stated that she needs to cxl her sx on 11/09/21 due to she is still waiting to hear back from her surgery plus insurance about Dr. Milinda Pointer being out of network and they are working on getting it fixed to where he is in network. Pt states she will call back to reschedule sx when this is all fixed. I have informed Caren Griffins with [place] and Dr. Milinda Pointer of this change.

## 2021-11-08 ENCOUNTER — Encounter: Payer: BC Managed Care – PPO | Admitting: Podiatry

## 2021-11-15 ENCOUNTER — Encounter: Payer: BC Managed Care – PPO | Admitting: Podiatry

## 2021-11-22 ENCOUNTER — Encounter: Payer: BC Managed Care – PPO | Admitting: Podiatry

## 2021-12-06 ENCOUNTER — Encounter: Payer: BC Managed Care – PPO | Admitting: Podiatry

## 2021-12-20 ENCOUNTER — Encounter: Payer: BC Managed Care – PPO | Admitting: Podiatry

## 2023-07-04 ENCOUNTER — Ambulatory Visit

## 2023-07-04 ENCOUNTER — Ambulatory Visit: Admitting: Sports Medicine

## 2023-07-04 DIAGNOSIS — S46012A Strain of muscle(s) and tendon(s) of the rotator cuff of left shoulder, initial encounter: Secondary | ICD-10-CM | POA: Diagnosis not present

## 2023-07-04 DIAGNOSIS — M19012 Primary osteoarthritis, left shoulder: Secondary | ICD-10-CM | POA: Diagnosis not present

## 2023-07-04 DIAGNOSIS — M75102 Unspecified rotator cuff tear or rupture of left shoulder, not specified as traumatic: Secondary | ICD-10-CM | POA: Insufficient documentation

## 2023-07-04 MED ORDER — TRAMADOL HCL 50 MG PO TABS: 50.0000 mg | ORAL_TABLET | Freq: Three times a day (TID) | ORAL | 0 refills | Status: DC | PRN

## 2023-07-04 NOTE — Progress Notes (Signed)
    Procedures performed today:    None.  Independent interpretation of notes and tests performed by another provider:   None.  Brief History, Exam, Impression, and Recommendations:    Rotator cuff tear, left This is an exquisitely pleasant 63 year old female, she has been doing a lot of exercising, getting healthy, she was doing some push-ups and unfortunately started to feel pain left shoulder over the deltoid. This happened several months ago. She saw her PCP, she was given meloxicam, home exercises. It was suggested that she had a pectoralis major injury. On exam today she has multiple positive impingement signs as well as a strongly positive empty can test with severe weakness consistent with a supraspinatus tear. She also has a positive speeds test concerning for biceps injury. Due to the traumatic nature of this problem, the duration of her symptoms as well as the need to return her to high level of activity we will proceed aggressively with x-ray, MRI, and referral for surgical repair if able. Return to see me to go over MRI results.    ____________________________________________ Ihor Austin. Benjamin Stain, M.D., ABFM., CAQSM., AME. Primary Care and Sports Medicine [facility]  Adjunct Professor of Family Medicine  [employer]  Restaurant manager, fast food

## 2023-07-04 NOTE — Assessment & Plan Note (Signed)
 This is an exquisitely pleasant 63 year old female, she has been doing a lot of exercising, getting healthy, she was doing some push-ups and unfortunately started to feel pain left shoulder over the deltoid. This happened several months ago. She saw her PCP, she was given meloxicam, home exercises. It was suggested that she had a pectoralis major injury. On exam today she has multiple positive impingement signs as well as a strongly positive empty can test with severe weakness consistent with a supraspinatus tear. She also has a positive speeds test concerning for biceps injury. Due to the traumatic nature of this problem, the duration of her symptoms as well as the need to return her to high level of activity we will proceed aggressively with x-ray, MRI, and referral for surgical repair if able. Return to see me to go over MRI results.

## 2023-07-07 ENCOUNTER — Telehealth: Payer: Self-pay

## 2023-07-07 NOTE — Telephone Encounter (Signed)
 Thank you :)

## 2023-07-07 NOTE — Telephone Encounter (Signed)
 Copied from CRM [id number]. Topic: General - Other >> Jul 07, 2023  3:01 PM Everette C wrote: Reason for CRM: Revonda Standard with [employer] has called to share that the prior authorization for the patients CT has been approved   Approval ID [id number]   Please contact further if needed

## 2023-07-12 ENCOUNTER — Other Ambulatory Visit

## 2023-07-15 ENCOUNTER — Ambulatory Visit (INDEPENDENT_AMBULATORY_CARE_PROVIDER_SITE_OTHER)

## 2023-07-15 DIAGNOSIS — S46012A Strain of muscle(s) and tendon(s) of the rotator cuff of left shoulder, initial encounter: Secondary | ICD-10-CM

## 2023-07-20 ENCOUNTER — Encounter (INDEPENDENT_AMBULATORY_CARE_PROVIDER_SITE_OTHER): Payer: Self-pay | Admitting: Sports Medicine

## 2023-07-20 DIAGNOSIS — S46012A Strain of muscle(s) and tendon(s) of the rotator cuff of left shoulder, initial encounter: Secondary | ICD-10-CM | POA: Diagnosis not present

## 2023-07-21 NOTE — Telephone Encounter (Signed)

## 2023-09-22 ENCOUNTER — Other Ambulatory Visit: Payer: Self-pay | Admitting: Sports Medicine

## 2023-09-22 DIAGNOSIS — S46012A Strain of muscle(s) and tendon(s) of the rotator cuff of left shoulder, initial encounter: Secondary | ICD-10-CM

## 2023-09-26 ENCOUNTER — Telehealth: Payer: Self-pay

## 2023-09-26 ENCOUNTER — Other Ambulatory Visit (HOSPITAL_COMMUNITY): Payer: Self-pay

## 2023-09-26 NOTE — Telephone Encounter (Signed)
 Pharmacy Patient Advocate Encounter  Received notification from [facility] that Prior Authorization for Tramadol  50mg  tabs has been APPROVED from 09/26/23 to 03/27/24. Unable to obtain price due to refill too soon rejection, last fill date 09/26/23 next available fill date 10/02/23   PA #/Case ID/Reference #: [id number]

## 2023-09-26 NOTE — Telephone Encounter (Signed)
 Pharmacy Patient Advocate Encounter   Received notification from CoverMyMeds that prior authorization for Tramadol  50mg  tab is required/requested.   Insurance verification completed.   The patient is insured through [facility] .   Per test claim: PA required; PA submitted to above mentioned insurance via CoverMyMeds Key/confirmation #/EOC [id number] Status is pending

## 2023-12-16 ENCOUNTER — Ambulatory Visit: Payer: Self-pay | Admitting: Sports Medicine

## 2023-12-17 ENCOUNTER — Ambulatory Visit (INDEPENDENT_AMBULATORY_CARE_PROVIDER_SITE_OTHER)

## 2023-12-17 ENCOUNTER — Other Ambulatory Visit: Payer: Self-pay

## 2023-12-17 VITALS — BP 120/72 | Ht 66.0 in | Wt 211.0 lb

## 2023-12-17 DIAGNOSIS — M75102 Unspecified rotator cuff tear or rupture of left shoulder, not specified as traumatic: Secondary | ICD-10-CM

## 2023-12-17 DIAGNOSIS — G8929 Other chronic pain: Secondary | ICD-10-CM | POA: Diagnosis not present

## 2023-12-17 DIAGNOSIS — M25512 Pain in left shoulder: Secondary | ICD-10-CM

## 2023-12-17 DIAGNOSIS — M75101 Unspecified rotator cuff tear or rupture of right shoulder, not specified as traumatic: Secondary | ICD-10-CM

## 2023-12-17 MED ORDER — METHYLPREDNISOLONE ACETATE 40 MG/ML IJ SUSP
40.0000 mg | Freq: Once | INTRAMUSCULAR | Status: AC
  Administered 2023-12-17: 40 mg via INTRA_ARTICULAR

## 2023-12-17 NOTE — Progress Notes (Signed)
   Subjective:    Patient ID: Deanna Caldwell, female    DOB: 63 y.o., [birth year]   MRN: [id number]  HPI  Chief Complaint: Partial-thickness tearing of the supraspinatus and left shoulder  Pt is a former patient of Dr. Curtis who was originally scheduled today for a left shoulder subacromial injection but she was suddenly rescheduled from his office for an unknown reason.  She presents today specifically requesting that we honor the original intention of her scheduled appointment with Dr. ONEIDA to receive a subacromial injection in her left shoulder. States she is not engaged in physical therapy after this Has been very active particularly with swimming or other activities at the gym Feels like staying active has helped this to some degree but it has still persisted enough to bother her No numbness or tingling in the upper extremity.  MRI 07/15/2023: 1. Moderate to high-grade tendinosis within the proximal tendon footprint of the mid AP dimension of the supraspinatus. There is a small linear tear within the mid AP dimension of the proximal supraspinatus tendon footprint that appears to extend to the bursal tendon surface and through the slightly more anterior articular tendon surface, however no significant tendon retraction is seen. 2. Moderate to high-grade marrow edema within the superolateral humeral head deep to the supraspinatus tendon insertion. This is likely stress related/reactive from the underlying supraspinatus tendinopathy. Within the marrow edema there is subtle linear decreased T1 and decreased T2 signal that may represent a tiny nondisplaced subcortical fracture line. 3. Mild-to-moderate degenerative changes of the acromioclavicular joint. 4. Mild-to-moderate fluid within the subacromial/subdeltoid bursa.      Objective:   Physical Exam Vitals:   12/17/23 0900  BP: 120/72   Left shoulder: 180 flexion.  180 abduction. Pain but no weakness with empty can.    Aspiration/Injection Procedure Note Deanna Caldwell [birth year] 12/16/2024 Procedure: Large Joint Aspiration / Injection of Shoulder, Subacromial Left Indications: Pain Procedure Details Verbal consent was obtained from the patient. Risks, benefits, and alternatives were explained. Patient prepped with Chloraprep and Ethyl Chloride used for anesthesia. The subacromial space was injected using the posterior approach. The patient tolerated the procedure well and had decreased pain post injection. No complications. Injection: 4 cc of mepivicaine 2% and 1 mL of Depo medrol  40 mg. Needle: 22 gauge     Assessment & Plan:   Blue is a very pleasant 63 year old right-hand-dominant female presenting with continued pain in her left shoulder after an MRI has confirmed a partial-thickness tear of the footprint of her supraspinatus.  She has had prior discussions with Dr. Curtis regarding treatment options and had a planned visit scheduled recently with him to obtain a subacromial steroid injection.  Unfortunately for unknown reasons this physician is not available and the patient instead is scheduled with myself and after my evaluation of her left shoulder pain today, I do believe this would be appropriate therapy for her.  I also recommended following this injection with physical therapy to optimize the strength of her tendon and likely provide the best benefit to her long-term.  She tolerated the procedure well, I referred her to PT, and I recommend follow-up in 6-8 weeks to reassess after engagement with physical therapy.  I spent greater than 20 minutes in direct conversation with the patient in evaluation in addition to discussion of treatment options and reviewing her prior MRI imaging results

## 2023-12-23 ENCOUNTER — Encounter: Payer: Self-pay | Admitting: Sports Medicine

## 2024-01-28 ENCOUNTER — Ambulatory Visit

## 2024-05-10 ENCOUNTER — Telehealth

## 2024-05-10 DIAGNOSIS — R202 Paresthesia of skin: Secondary | ICD-10-CM

## 2024-05-10 DIAGNOSIS — M79672 Pain in left foot: Secondary | ICD-10-CM | POA: Diagnosis not present

## 2024-05-10 DIAGNOSIS — R200 Anesthesia of skin: Secondary | ICD-10-CM

## 2024-05-10 MED ORDER — PREDNISONE 10 MG (21) PO TBPK: ORAL_TABLET | ORAL | 0 refills | Status: AC

## 2024-05-10 NOTE — Progress Notes (Signed)
 " Virtual Visit Consent   Deanna Caldwell, you are scheduled for a virtual visit with a [facility] provider today. Just as with appointments in the office, your consent must be obtained to participate. Your consent will be active for this visit and any virtual visit you may have with one of our providers in the next 365 days. If you have a MyChart account, a copy of this consent can be sent to you electronically.  As this is a virtual visit, video technology does not allow for your provider to perform a traditional examination. This may limit your provider's ability to fully assess your condition. If your provider identifies any concerns that need to be evaluated in person or the need to arrange testing (such as labs, EKG, etc.), we will make arrangements to do so. Although advances in technology are sophisticated, we cannot ensure that it will always work on either your end or our end. If the connection with a video visit is poor, the visit may have to be switched to a telephone visit. With either a video or telephone visit, we are not always able to ensure that we have a secure connection.  By engaging in this virtual visit, you consent to the provision of healthcare and authorize for your insurance to be billed (if applicable) for the services provided during this visit. Depending on your insurance coverage, you may receive a charge related to this service.  I need to obtain your verbal consent now. Are you willing to proceed with your visit today? Deanna Caldwell has provided verbal consent on 05/10/2024 for a virtual visit (video or telephone). Deanna Caldwell, [place]  Date: 05/10/2024 3:42 PM   Virtual Visit via Video Note   I, Deanna Caldwell, connected with  Deanna Caldwell  ([id number], 1961/03/02) on 05/10/24 at  3:30 PM EST by a video-enabled telemedicine application and verified that I am speaking with the correct person using two identifiers.  Location: Patient: Virtual Visit  Location Patient: Home Provider: Virtual Visit Location Provider: Home Office   I discussed the limitations of evaluation and management by telemedicine and the availability of in person appointments. The patient expressed understanding and agreed to proceed.    History of Present Illness: Deanna Caldwell is a 64 y.o. who identifies as a female who was assigned female at birth, and is being seen today for several months of intermittent pain, numbness and tingling across the distal left foot. Patient with history of multiple surgeries of L foot (hammer toes). Denies any recent trauma or injury. Notes symptoms starting after having to wear steel-toed shoes at work about 3 months ago. Notes she feels the shoes are appropriately sized and otherwise feel comfortable. Notes when resting off of her feet, symptoms improve/resolve. Occasionally will happen at home but short-lived. Denies numbness or tingling of the rest of the LLE. Occasionally a slight pain in left hip but unsure if related. Notes normal ROM of digits.     HPI: HPI  Problems:  Patient Active Problem List   Diagnosis Date Noted   Rotator cuff tear, left 07/04/2023   Female stress incontinence 09/27/2021   Insomnia 09/27/2021   Menopause present 09/27/2021   Pain of breast 09/27/2021   Reduced libido 09/27/2021   Vaginitis 09/27/2021   Primary hyperparathyroidism 11/24/2020   Ganglion cyst of volar aspect of left wrist 03/10/2019   Cubital tunnel syndrome on right 01/20/2019   Family history of neoplasm of breast 01/14/2018   Haglund's  deformity of left heel 06/30/2017   Retained orthopedic hardware 06/30/2017   Left Achilles tendinitis 12/20/2016   Plantar fasciitis, left 10/18/2016   Diabetes mellitus (HCC) 11/23/2015   Hypertension 11/23/2015   Mixed hyperlipidemia 11/23/2015   Non morbid obesity due to excess calories 11/23/2015   Primary osteoarthritis of both knees 08/19/2014   Severe episode of recurrent major  depressive disorder (HCC) 12/14/2012   Anxiety disorder 12/14/2012   Major depressive disorder 11/12/2011   Orbital lesion 06/25/2011    Allergies: Allergies[1] Medications: Current Medications[2]  Observations/Objective: Patient is well-developed, well-nourished in no acute distress.  Resting comfortably  at home.  Head is normocephalic, atraumatic.  No labored breathing.  Speech is clear and coherent with logical content.  Patient is alert and oriented at baseline.  Unable to fully visualize the extremity due to patient positioning and camera.  Assessment and Plan: 1. Left foot pain (Primary) - predniSONE  (STERAPRED UNI-PAK 21 TAB) 10 MG (21) TBPK tablet; Take following package directions  Dispense: 21 tablet; Refill: 0  2. Numbness and tingling of lower extremity - predniSONE  (STERAPRED UNI-PAK 21 TAB) 10 MG (21) TBPK tablet; Take following package directions  Dispense: 21 tablet; Refill: 0  Ongoing. Starting after having to use steel toed shoes. While this could be a coincidence, it could certainly be a major contributing factor to nerve irritation. Supportive measures and other appropriate footwear reviewed. Start Sterapred pack. Schedule follow-up with Podiatry.  Follow Up Instructions: I discussed the assessment and treatment plan with the patient. The patient was provided an opportunity to ask questions and all were answered. The patient agreed with the plan and demonstrated an understanding of the instructions.  A copy of instructions were sent to the patient via MyChart unless otherwise noted below.   The patient was advised to call back or seek an in-person evaluation if the symptoms worsen or if the condition fails to improve as anticipated.    Deanna Velma Lunger, PA-C    [1] No Known Allergies [2]  Current Outpatient Medications:    predniSONE  (STERAPRED UNI-PAK 21 TAB) 10 MG (21) TBPK tablet, Take following package directions, Disp: 21 tablet, Rfl: 0   AMBULATORY  NON FORMULARY MEDICATION, Rolling Knee Scooter, please take Rx to medical supply store., Disp: 1 each, Rfl: 0   LOSARTAN POTASSIUM-HCTZ PO, Take by mouth., Disp: , Rfl:    traMADol  (ULTRAM ) 50 MG tablet, TAKE 1 TABLET (50 MG TOTAL) BY MOUTH EVERY 8 (EIGHT) HOURS AS NEEDED FOR MODERATE PAIN (PAIN SCORE 4-6)., Disp: 21 tablet, Rfl: 0  "

## 2024-05-10 NOTE — Patient Instructions (Signed)
" °  Sharlet CHRISTELLA Mura, thank you for joining Elsie Velma Lunger, PA-C for today's virtual visit.  While this provider is not your primary care provider (PCP), if your PCP is located in our provider database this encounter information will be shared with them immediately following your visit.   A [facility] MyChart account gives you access to today's visit and all your visits, tests, and labs performed at [facility]  click here if you don't have a [place] MyChart account or go to [email or web address]  Consent: (Patient) Deanna Caldwell provided verbal consent for this virtual visit at the beginning of the encounter.  Current Medications:  Current Outpatient Medications:    AMBULATORY NON FORMULARY MEDICATION, Rolling Knee Scooter, please take Rx to medical supply store., Disp: 1 each, Rfl: 0   LOSARTAN POTASSIUM-HCTZ PO, Take by mouth., Disp: , Rfl:    traMADol  (ULTRAM ) 50 MG tablet, TAKE 1 TABLET (50 MG TOTAL) BY MOUTH EVERY 8 (EIGHT) HOURS AS NEEDED FOR MODERATE PAIN (PAIN SCORE 4-6)., Disp: 21 tablet, Rfl: 0   Medications ordered in this encounter:  No orders of the defined types were placed in this encounter.    *If you need refills on other medications prior to your next appointment, please contact your pharmacy*  Follow-Up: Call back or seek an in-person evaluation if the symptoms worsen or if the condition fails to improve as anticipated.  [facility] [phone]  Other Instructions Please make sure to wear shows with a wide toe box, that are not laced too tightly. Take the steel toed shoes off as soon as you are able, maybe carrying another pair to change into right at end of shift. Take the sterapred pack as directed. Schedule follow-up with you Podiatrist for further evaluation.   If you have been instructed to have an in-person evaluation today at a local Urgent Care facility, please use the link below. It will take you to a list of all  of our available [facility], including address, phone number and hours of operation. Please do not delay care.  [facility]  If you or a family member do not have a primary care provider, use the link below to schedule a visit and establish care. When you choose a [place] primary care physician or advanced practice provider, you gain a long-term partner in health. Find a Primary Care Provider  Learn more about [place]'s in-office and virtual care options: [facility] - Get Care Now  "

## 2024-06-01 ENCOUNTER — Ambulatory Visit: Admitting: Podiatry

## 2024-06-03 ENCOUNTER — Ambulatory Visit: Admitting: Podiatry
